# Patient Record
Sex: Male | Born: 1940 | Hispanic: Yes | Marital: Married | Smoking: Current every day smoker
Health system: Southern US, Community
[De-identification: ages and names within clinical notes are randomized; demographics above are authoritative.]

## PROBLEM LIST (undated history)

## (undated) DIAGNOSIS — I499 Cardiac arrhythmia, unspecified: Secondary | ICD-10-CM

## (undated) DIAGNOSIS — C61 Malignant neoplasm of prostate: Secondary | ICD-10-CM

## (undated) HISTORY — PX: INGUINAL HERNIA REPAIR: SUR1180

---

## 2014-02-18 DIAGNOSIS — C61 Malignant neoplasm of prostate: Secondary | ICD-10-CM

## 2014-02-18 HISTORY — PX: PROSTATE BIOPSY: SHX241

## 2014-02-18 HISTORY — DX: Malignant neoplasm of prostate: C61

## 2014-02-26 ENCOUNTER — Other Ambulatory Visit (HOSPITAL_COMMUNITY): Payer: Self-pay | Admitting: Urology

## 2014-02-26 DIAGNOSIS — C61 Malignant neoplasm of prostate: Secondary | ICD-10-CM

## 2014-03-07 ENCOUNTER — Encounter: Payer: Self-pay | Admitting: Radiation Oncology

## 2014-03-07 ENCOUNTER — Telehealth: Payer: Self-pay | Admitting: *Deleted

## 2014-03-07 NOTE — Telephone Encounter (Signed)
Called patient to introduce myself as Prostate Oncology Navigator and coordinator of the Prostate [place], spoke with patient's grandson who provided interpretation: 1)  confirmed referral for the clinic on 03/11/14 with an arrival time of 12:15,  2)  explained format of clinic,  3)  confirmed his understanding of [place] location, 4)  explained arrival and registration procedure, and   5)  indicated I am mailing an [place] to him that contains medical information forms which he should complete and bring with him on the day of clinic.  I provided my phone number and encouraged him to call me if he has any questions after receiving the Information Packet or prior to my call the day before clinic.  He verbalized understanding of information provided. Arrangements being made for interpreter to be present at [facility].  Gayleen Orem, RN, BSN, [place] at [place] [phone]

## 2014-03-07 NOTE — Progress Notes (Signed)
[facility] Location of Tumor / Histology: prostate adenocarcinoma  If Prostate Cancer, Gleason Score is ( 4+ 4) and PSA is (62.82 on 02/06/14)  Joshua Pittman presented 1 month ago with signs/symptoms of: elevated PSA found during screening  Biopsies of  prostate revealed:  02/18/14 Volume 50 cc, 13/14 cores positive  Past/Anticipated interventions by urology, if any: biopsy  Past/Anticipated interventions by medical oncology, if any: to be seen in [facility] on 03/11/14  Weight changes, if any:   Bowel/Bladder complaints, if any:    Nausea/Vomiting, if any:   Pain issues, if any:    SAFETY ISSUES:  Prior radiation?   Pacemaker/ICD? no  Possible current pregnancy? na  Is the patient on methotrexate? no  Current Complaints / other details:  Married, 2 sons, retired, from [place]

## 2014-03-10 ENCOUNTER — Encounter: Payer: Self-pay | Admitting: *Deleted

## 2014-03-10 NOTE — Progress Notes (Signed)
Interpreter Lavon Paganini called patient to inform him that his appt for Prostate [facility] has been cancelled for tomorrow and rescheduled for 10/9.  She spoke with the patient's wife.  Gayleen Orem, RN, BSN, [place] at [place] [phone]

## 2014-03-11 ENCOUNTER — Ambulatory Visit
Admission: RE | Admit: 2014-03-11 | Discharge: 2014-03-11 | Disposition: A | Discharge status: Home / Self Care | Payer: Self-pay | Source: Ambulatory Visit | Attending: Radiation Oncology | Admitting: Radiation Oncology

## 2014-03-11 HISTORY — DX: Cardiac arrhythmia, unspecified: I49.9

## 2014-03-11 HISTORY — DX: Malignant neoplasm of prostate: C61

## 2014-03-14 ENCOUNTER — Encounter (HOSPITAL_COMMUNITY)
Admission: RE | Admit: 2014-03-14 | Discharge: 2014-03-14 | Disposition: A | Discharge status: Home / Self Care | Payer: No Typology Code available for payment source | Source: Ambulatory Visit | Attending: Urology | Admitting: Urology

## 2014-03-14 DIAGNOSIS — C61 Malignant neoplasm of prostate: Secondary | ICD-10-CM | POA: Diagnosis not present

## 2014-03-14 MED ORDER — TECHNETIUM TC 99M MEDRONATE IV KIT
24.3000 | PACK | Freq: Once | INTRAVENOUS | Status: AC | PRN
  Administered 2014-03-14: 24.3 via INTRAVENOUS

## 2014-03-24 ENCOUNTER — Telehealth: Payer: Self-pay | Admitting: Oncology

## 2014-03-24 NOTE — Telephone Encounter (Signed)
Chart is ready for Prostate clinic on Friday.

## 2014-03-26 ENCOUNTER — Telehealth: Payer: Self-pay | Admitting: *Deleted

## 2014-03-26 NOTE — Telephone Encounter (Signed)
With assistance from interpreter Lavon Paganini, called patient to see if he had any questions prior to his attendance at tomorrow's Prostate [place].  She spoke with his wife.  There were no questions, confirmed he would bring the completed health information forms he received in the mailing I sent, confirmed his understanding of a 7:45 am arrival time, confirmed his understanding of the [facility] location.  Gayleen Orem, RN, BSN, [place] at [place] [phone]

## 2014-03-27 ENCOUNTER — Encounter: Payer: Self-pay | Admitting: Radiation Oncology

## 2014-03-27 DIAGNOSIS — C61 Malignant neoplasm of prostate: Secondary | ICD-10-CM | POA: Insufficient documentation

## 2014-03-27 NOTE — Progress Notes (Signed)
Radiation Oncology         [phone] ________________________________  Multidisciplinary Prostate Cancer Clinic  Initial Radiation Oncology Consultation  Name: Joshua Pittman MRN: [id number]  Date: 03/28/2014  DOB: January 24, 1941  CC:No primary provider on file.  [name], MD   REFERRING PHYSICIAN: [name], MD  DIAGNOSIS: 73 y.o. 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 gentleman who describes having an elevated PSA when he was living in [place].  He moved to [place] to live with his family.  He was noted to have an elevated PSA of 46.08 by his primary care physician, Dr. York Ram.  Accordingly, he was referred for evaluation in urology by Dr. Louis Meckel on 02/06/14,  digital rectal examination was performed at that time revealing a 1+ gland with a palpable nodule involving the left mid-gland.  Repeat PSA was 62.82.  The patient proceeded to transrectal ultrasound with 12 biopsies of the prostate on 02/18/14.  The prostate volume measured 50 cc.  Out of 14 core biopsies, 13 were positive.  The maximum Gleason score was 4+4, and this was seen in the graphic displayed distribution shown below.  The patient reviewed the biopsy results with his urologist and he has kindly been referred today to the multidisciplinary prostate cancer clinic for presentation of pathology and radiology studies in our conference for discussion of potential radiation treatment options and clinical evaluation.  PREVIOUS RADIATION THERAPY: No  PAST MEDICAL HISTORY:  has a past medical history of Prostate cancer (02/18/14) and Arrhythmia.    PAST SURGICAL HISTORY: Past Surgical History  Procedure Laterality Date  . Inguinal hernia repair    . Prostate biopsy  02/18/14    gleason 8, vol 50 cc    FAMILY HISTORY: family history includes Asthma in his mother. There is no  history of Cancer.  SOCIAL HISTORY:  reports that he has been smoking Cigarettes.  He has a 8.75 pack-year smoking history. He has never used smokeless tobacco. He reports that he drinks alcohol.  ALLERGIES: Review of patient's allergies indicates no known allergies.  MEDICATIONS:  No current outpatient prescriptions on file.   No current facility-administered medications for this encounter.    REVIEW OF SYSTEMS:  A 15 point review of systems is documented in the electronic medical record. This was obtained by the nursing staff. However, I reviewed this with the patient to discuss relevant findings and make appropriate changes.  A comprehensive review of systems was negative..  The patient completed an IPSS and IIEF questionnaire.   PHYSICAL EXAM: This patient is in no acute distress.  He is alert and oriented.   height is 5' 8.9" (1.75 m) and weight is 150 lb (68.04 kg). His blood pressure is 174/74 and his pulse is 62. His respiration is 16.  He exhibits no respiratory distress or labored breathing.  He appears neurologically intact.  His mood is pleasant.  His affect is appropriate.  Please note the digital rectal exam findings described above.  KPS = 100  100 - Normal; no complaints; no evidence of disease. 90   - Able to carry on normal activity; minor signs or symptoms of disease. 80   - Normal activity with effort; some signs or symptoms of disease. 4   - Cares for self; unable to carry on normal activity or to do active work. 60   - Requires  occasional assistance, but is able to care for most of his personal needs. 50   - Requires considerable assistance and frequent medical care. 58   - Disabled; requires special care and assistance. 54   - Severely disabled; hospital admission is indicated although death not imminent. 87   - Very sick; hospital admission necessary; active supportive treatment necessary. 10   - Moribund; fatal processes progressing rapidly. 0     -  Dead  Karnofsky DA, Abelmann WH, Craver LS and Burchenal JH [phone]) The use of the nitrogen mustards in the palliative treatment of carcinoma: with particular reference to bronchogenic carcinoma Cancer 1 [phone]   LABORATORY DATA:  No results found for this basename: WBC,  HGB,  HCT,  MCV,  PLT   No results found for this basename: NA,  K,  CL,  CO2   No results found for this basename: ALT,  AST,  GGT,  ALKPHOS,  BILITOT     RADIOGRAPHY: Nm Bone Scan Whole Body  03/14/2014   CLINICAL DATA:  Prostate carcinoma  EXAM: NUCLEAR MEDICINE WHOLE BODY BONE SCAN  Views:  Whole-body  Radionuclide:  Technetium 89m methylene diphosphonate  Dose:  24.3 mCi  Route of administration: Intravenous  COMPARISON:  None  FINDINGS: There is increased radiotracer uptake in the superior right acetabulum as well as in the right sacroiliac joint and mid sacrum just to the right of midline. There is modest increased uptake in the regions of the pedicles of L2 and L3 on the left. There is increased uptake in the medial left knee. Elsewhere the distribution of uptake is normal. Kidneys are noted in the flank positions bilaterally. Note that there is dextroscoliosis in the lumbar spine.  IMPRESSION: Areas of increased uptake in the right superior acetabulum, mid sacrum just to the right of midline, and right sacroiliac joint. These areas are concerning for bony metastatic disease. Slight increased uptake in the pedicles at L2 and L3 on the left are noted. Note that there is scoliosis which may be causing increased uptake in these areas due to reactive/degenerative type change as opposed to neoplastic etiology. Increased uptake in the medial left knee is most likely of arthropathic etiology.   Electronically Signed   By: Lowella Grip M.D.   On: 03/14/2014 15:07      IMPRESSION: This gentleman is a 73 y.o. 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  His T-Stage,  Gleason's Score, and PSA put him into the high risk group.  Accordingly he is eligible for a variety of potential treatment options including upfront robotic-assisted laparoscopic radical prostatectomy followed by tailored radiotherapy depending on pathology findings versus up front androgen deprivation for 2-3 years with definitive localized radiotherapy  PLAN:Today I reviewed the findings and workup thus far.  We discussed the natural history of prostate cancer.  We reviewed the the implications of T-stage, Gleason's Score, and PSA on decision-making and outcomes related to prostate cancer.  We discussed radiation treatment in the management of prostate cancer with regard to the logistics and delivery of external beam radiation treatment as well as the logistics and delivery of prostate brachytherapy.  We compared and contrasted each of these approaches and also compared these against prostatectomy.    The patient focused most of his questions and interest in robotic-assisted laparoscopic radical prostatectomy.  We discussed some of the potential advantages of surgery including surgical staging, the availability of salvage radiotherapy to  the prostatic fossa, and the confidence associated with immediate biochemical response.  We discussed some of the potential proven indications for postoperative radiotherapy including positive margins, extracapsular extension, and seminal vesicle involvement. We also talked about some of the other potential findings leading to a recommendation for radiotherapy including a non-zero postoperative PSA and positive lymph nodes.   I filled out a patient counseling form for him outlining his disease characteristics with relevant treatment diagrams and a thorough delineation of radiation including the logistics. The counseling form highlighted our discussion on the potential side effects associated with radiation therapy. The patient was given the form and we retained a copy for  our records.   The patient would like to proceed with prostatectomy. I enjoyed meeting with him today, and will look forward to participating in the care of this very nice gentleman.  I will look forward to following his progress   I spent 60 minutes minutes face to face with the patient and more than 50% of that time was spent in counseling and/or coordination of care.      ------------------------------------------------  Sheral Apley. Tammi Klippel, M.D.

## 2014-03-28 ENCOUNTER — Encounter: Payer: Self-pay | Admitting: *Deleted

## 2014-03-28 ENCOUNTER — Ambulatory Visit (HOSPITAL_BASED_OUTPATIENT_CLINIC_OR_DEPARTMENT_OTHER): Payer: No Typology Code available for payment source | Admitting: Oncology

## 2014-03-28 ENCOUNTER — Encounter: Payer: Self-pay | Admitting: Radiation Oncology

## 2014-03-28 ENCOUNTER — Ambulatory Visit
Admission: RE | Admit: 2014-03-28 | Discharge: 2014-03-28 | Disposition: A | Discharge status: Home / Self Care | Payer: No Typology Code available for payment source | Source: Ambulatory Visit | Attending: Urology | Admitting: Urology

## 2014-03-28 ENCOUNTER — Encounter: Payer: Self-pay | Admitting: Oncology

## 2014-03-28 VITALS — BP 174/74 | HR 62 | Resp 16 | Ht 68.9 in | Wt 150.0 lb

## 2014-03-28 DIAGNOSIS — C61 Malignant neoplasm of prostate: Secondary | ICD-10-CM

## 2014-03-28 NOTE — Consult Note (Signed)
Chief Complaint  Prostate Cancer   Reason For Visit  Reason for consult: To discuss treatment options for high risk prostate cancer. Physician requesting consult: Dr. Burman Nieves PCP: Dr. York Ram Location of Consult: [place]   History of Present Illness  Joshua Pittman is a healthy 73 year old gentleman who was noted to have an elevated PSA during a screening in [place].  His PSA was rechecked by Dr. Jimmye Norman in July 2015 and was found to be 46.08 prompting urologic referral.  He was seen by Dr. Burman Nieves in August 2015 who noted nodularity of the left base and mid prostate and a repeat PSA at that time was 62.82.  He underwent a prostate biopsy on 02/18/14 which demonstrated Gleason 8 adenocarcinoma with 13 out of 14 biopsy cores positive for malignancy. He has no family history of prostate cancer. He underwent staging studies including a bone scan on 04-03-14 and CT scan of the pelvis on 03/25/14.  His bone scan did demonstrate some uptake in the right hemipelvis but no other areas of increased uptake that would suggest metastasis.  Review of his CT scan demonstrated a lucency in the right hemipelvis that appears consistent with a benign process such as prior Paget's disease or degenerative change but without obvious sclerotic lesions to suggest metastatic cancer.  TNM stage: cT3a Nx Mx PSA: 62.82 Gleason score: 4+4=8, 3+5=8 Biopsy (02/18/14): 13/14 cores positive    Left: L Lateral aepx (60%, 4+3=7), L apex (60%, 4+3=7), L lateral mid (90%, 4+3=7), L mid (80%, 4+3=7), L lateral base (95% - 4+3=7, 95% - 4+4=8, PNI), L base (50% - 4+3=7, 50% - 4+4=8)    Right: R apex (70%, 3+3=6), R mid (80%, 3+4=7, PNI), R lateral mid (50%, 3+4=7), R base (80%, 3+5=8), R lateral base (80%, 4+3=7) Prostate volume: 50 cc  Nomogram OC disease: 1% EPE: 99% SVI: 99% LNI: 95% PFS (surgery): 4% at 5 years, 2% at 10 years  Urinary function: He has minimal baseline LUTS. IPSS is  1. Erectile function: SHIM score is 6.  This is in part due to the fact that he has been sexually inactive over the past year.  In fact Joshua Pittman informs me today that he has been sexually inactive for many years and that this is a very low priority for him and his wife.   His history was obtained through an interpreter today.   Past Medical History  1. History of No significant past medical history  Surgical History  1. History of Inguinal Hernia Repair   He has undergone 2 prior right inguinal hernia repairs with mesh.   Current Meds  1. Prostate Support TABS;  Therapy: (Recorded:20Aug2015) to Recorded  Allergies  1. No Known Drug Allergies  Family History  1. Family history of Death of family member : Mother, Father  2. Family history of asthma (Z82.5) : Mother  Social History   Alcohol use (F10.99)   Current every day smoker (F72.200)   Married   Retired  Review of Systems AU Complete-Male: Constitutional, skin, eye, otolaryngeal, hematologic/lymphatic, cardiovascular, pulmonary, endocrine, musculoskeletal, gastrointestinal, neurological and psychiatric system(s) were reviewed and pertinent findings if present are noted.    Physical Exam Constitutional: Well nourished and well developed . No acute distress.  ENT:. The ears and nose are normal in appearance.  Neck: The appearance of the neck is normal and no neck mass is present.  Pulmonary: No respiratory distress, normal respiratory rhythm and effort and clear bilateral  breath sounds.  Cardiovascular: Heart rate and rhythm are normal . No peripheral edema.  Abdomen: The abdomen is soft and nontender. No masses are palpated. No CVA tenderness. No hernias are palpable. No hepatosplenomegaly noted.   He has no abdominal scars.  Specifically, I asked him about the high density area noted in his right upper quadrant on CT scan.  He denies a history of prior trauma, gunshot wound, or surgery in this area.  Speaking with  radiology, it was felt that this may even be within the bowel.  Rectal: Rectal exam demonstrates normal sphincter tone, no tenderness and no masses. Prostate size is estimated to be 50 g. He does have extensive involvement of left side of the prostate including the base and mid gland with evidence of extraprostatic extension insistent with clinical stage TIIIa prostate cancer. The prostate is not tender. The left seminal vesicle is nonpalpable. The right seminal vesicle is nonpalpable. The perineum is normal on inspection.  Lymphatics: The femoral and inguinal nodes are not enlarged or tender.  Skin: Normal skin turgor, no visible rash and no visible skin lesions.  Neuro/Psych:. Mood and affect are appropriate.    Results/Data  We have reviewed his medical records including his PSA results, his pathology slides, and his imaging studies in the multidisciplinary conference today.  Findings are as outlined above.     Discussion/Summary   1.  High risk, locally advanced prostate cancer: I had a long discussion with the patient, his wife, and his son today regarding his prostate cancer situation and options for treatment.  I explained to him that he does not have any obvious evidence of measurable metastatic disease although is at very high risk for micrometastatic cancer considering his disease parameters.  Although he understands what the realistic chances for cure are considering the high risk for micrometastatic disease, he is a candidate for therapy of curative intent with no obvious measurable metastases.  I did recommend to him as standard therapy long-term androgen deprivation therapy and external beam radiation treatment.  We discussed that this would be standard of care treatment and would address systemic disease as well as his local disease.  He and his family were also very interested in primary surgical therapy.  I explained to him that this is an option albeit with an increased risk of urinary  incontinence and understanding that he'll most definitely would require adjuvant therapy with either radiation therapy and possibly with systemic androgen deprivation.  We extensively reviewed the pros and cons today the patient understands that there is no evidence of primary surgical therapy would increase his chances for long-term survival or freedom from disease recurrence.   The patient was counseled about the natural history of prostate cancer and the standard treatment options that are available for prostate cancer. It was explained to him how his age and life expectancy, clinical stage, Gleason score, and PSA affect his prognosis, the decision to proceed with additional staging studies, as well as how that information influences recommended treatment strategies. We discussed the roles for active surveillance, radiation therapy, surgical therapy, androgen deprivation, as well as ablative therapy options for the treatment of prostate cancer as appropriate to his individual cancer situation. We discussed the risks and benefits of these options with regard to their impact on cancer control and also in terms of potential adverse events, complications, and impact on quiality of life particularly related to urinary, bowel, and sexual function. The patient was encouraged to ask questions throughout the discussion  today and all questions were answered to his stated satisfaction. In addition, the patient was provided with and/or directed to appropriate resources and literature for further education about prostate cancer and treatment options.   We discussed surgical therapy for prostate cancer including the different available surgical approaches. We discussed, in detail, the risks and expectations of surgery with regard to cancer control, urinary control, and erectile function as well as the expected postoperative recovery process. Additional risks of surgery including but not limited to bleeding, infection,  hernia formation, nerve damage, lymphocele formation, bowel/rectal injury potentially necessitating colostomy, damage to the urinary tract resulting in urine leakage, urethral stricture, and the cardiopulmonary risks such as myocardial infarction, stroke, death, venothromboembolism, etc. were explained. The risk of open surgical conversion for robotic/laparoscopic prostatectomy was also discussed.      Joshua Pittman also met with Dr. Tammi Klippel and Dr. Alen Blew today.  After considering his options, he and his family remains adamant that they would like to proceed with primary surgical therapy and clearly expressed her understanding of the very low risk for cure with surgical therapy alone, the very likely need for adjuvant therapy in the form of radiation treatment or systemic androgen deprivation therapy, and the increased risk for urinary incontinence with this approach.  I will plan to talk with Dr. Louis Meckel and we'll plan to proceed according to Joshua Pittman' wishes.  He will tentatively be scheduled for a non-nerve sparing robotic-assisted laparoscopic radical prostatectomy and extended pelvic lymphadenectomy with plans to proceed with adjuvant therapy is indicated based on his surgical pathology.  Cc: Dr. Zola Button Dr. Tyler Pita Dr. Louis Meckel Dr. York Ram  A total of 75 minutes were spent in the overall care of the patient today with 65 minutes in direct face to face consultation.  All information was communicated through a Spanish interpreter today.    Signatures Electronically signed by : [name], M.D.; Mar 28 2014 11:22AM EST

## 2014-03-28 NOTE — Progress Notes (Signed)
[place] Work     Clinical Social Work met with pt and his wife, niece and nephew at [facility] for assessment of psychosocial needs and review of [place] Support Programs/Team. CSW was assisted by an interpreter as pt's native language is Spanish. Pt reports he moved with his wife from [place] two years ago.  Pt reports to feel very little distress and feels well-informed after meeting with the medical team today. CSW discussed common emotions and concerns experienced by patients and families in their situation. Pt and wife report to not drive and their biggest concern is transportation. They can get their niece and nephew to assist occasionally, but both work. CSW reviewed transportation resources; [facility], the bus and [employer]. Due to language barriers, they report to not to utilize the bus for transportation. Clinical Social Worker discussed resources and members of the Pt and Family Support Team, Prostate Support group and other supports available. CSW gave handouts for all applicable resources and reviewed them.  Pt and family very appreciative of support and agree to reach out to team as needed.   ONCBCN DISTRESS SCREENING 03/28/2014  Screening Type Initial Screening  Mark the number that describes how much distress you have been experiencing in the past week 2  Practical problem type Transportation   Clinical Social Work Interventions: Resource education Solution focused interviewing Emotional support  Loren Racer, [place]  [place] Phone: [phone] Fax: [phone]

## 2014-03-28 NOTE — Progress Notes (Signed)
Met with patient as part of Prostate MDC.  Interpreter present for MD and support staff visits.  Reintroduced my role as coordinator of the clinic  and encouraged him to call me should he proceed with treatments and appointments at [facility].   After MD discussions, patient decided to move forward with prostatectomy.  I explained that he will receive a phone call from his urologist, Dr. Louis Meckel, [facility], to arrange an appt to discuss further.  He verbalized understanding.  He requested that Dr. Louis Meckel provide a signed letter that provides details of the surgery, including date/time, recovery period.  This letter is needed to facilitate his sister's travel from [place] to [place] so that she can be with him.  I indicated I would relay this request to Dr. Louis Meckel.  Gayleen Orem, RN, BSN, [place] at [place] [phone]

## 2014-03-28 NOTE — Consult Note (Signed)
Reason for Referral: Prostate cancer.   HPI: 73 year old [place] gentleman currently of [place] where he lives with his family. He is rather healthy gentleman without any significant comorbid conditions who is referred by his primary care physician to Dr. Louis Meckel for an elevated PSA of 46.08 in July of 2015. Upon his evaluation, he was found to have a palpable nodule in the left aspect of the prostate. Otherwise examination was normal. He had very little voiding symptoms he does report some urinary urinary frequency dysuria and occasional nocturia. He underwent a biopsy on 02/06/2014 which showed prostate cancer high volume disease with Gleason score 4+4 equals 8 in the majority of the cores. Patient referred to the multidisciplinary breast clinic for further discussion.  Clinically, he feels relatively well and reports no major constitutional symptoms. He does not report any fevers or chills or sweats. He reports no chest pain or shortness of breath. He reports no nausea or vomiting or abdominal pain. He does not report any skeletal complaints or arthralgias or myalgias. Continues to be in excellent performance status and the rest of review of systems unremarkable.  Past Medical History  Diagnosis Date  . Prostate cancer 02/18/14    gleason 8  . Arrhythmia   :  Past Surgical History  Procedure Laterality Date  . Inguinal hernia repair    . Prostate biopsy  02/18/14    gleason 8, vol 50 cc  :  No current outpatient prescriptions on file.:  No Known Allergies:  Family History  Problem Relation Age of Onset  . Asthma Mother   . Cancer Neg Hx   :  History   Social History  . Marital Status: Married    Spouse Name: N/A    Number of Children: N/A  . Years of Education: N/A   Occupational History  . Not on file.   Social History Main Topics  . Smoking status: Current Every Day Smoker -- 0.25 packs/day for 35 years    Types: Cigarettes  . Smokeless tobacco: Never Used      Comment: 3-4 cigs daily  . Alcohol Use: Yes     Comment: beer socially on the weekends  . Drug Use: Not on file  . Sexual Activity: Yes   Other Topics Concern  . Not on file   Social History Narrative  . No narrative on file  :  Pertinent items are noted in HPI.  Exam: There were no vitals taken for this visit. General appearance: alert and cooperative Nose: Nares normal. Septum midline. Mucosa normal. No drainage or sinus tenderness. Throat: lips, mucosa, and tongue normal; teeth and gums normal Neck: no adenopathy Back: negative Resp: clear to auscultation bilaterally Cardio: regular rate and rhythm, S1, S2 normal, no murmur, click, rub or gallop GI: soft, non-tender; bowel sounds normal; no masses,  no organomegaly Extremities: extremities normal, atraumatic, no cyanosis or edema    03/14/2014   CLINICAL DATA:  Prostate carcinoma  EXAM: NUCLEAR MEDICINE WHOLE BODY BONE SCAN  Views:  Whole-body  Radionuclide:  Technetium 12m methylene diphosphonate  Dose:  24.3 mCi  Route of administration: Intravenous  COMPARISON:  None  FINDINGS: There is increased radiotracer uptake in the superior right acetabulum as well as in the right sacroiliac joint and mid sacrum just to the right of midline. There is modest increased uptake in the regions of the pedicles of L2 and L3 on the left. There is increased uptake in the medial left knee. Elsewhere the distribution of uptake is  normal. Kidneys are noted in the flank positions bilaterally. Note that there is dextroscoliosis in the lumbar spine.  IMPRESSION: Areas of increased uptake in the right superior acetabulum, mid sacrum just to the right of midline, and right sacroiliac joint. These areas are concerning for bony metastatic disease. Slight increased uptake in the pedicles at L2 and L3 on the left are noted. Note that there is scoliosis which may be causing increased uptake in these areas due to reactive/degenerative type change as opposed to  neoplastic etiology. Increased uptake in the medial left knee is most likely of arthropathic etiology.   Electronically Signed   By: Lowella Grip M.D.   On: 03/14/2014 15:07    Assessment and Plan:    73 year old gentleman with a diagnosis of being confined high-risk prostate cancer. His Gleason score is 4+4 equals 8 with a PSA of 62.82. His case was discussed and the prostate cancer multidisciplinary clinic. This pathology was reviewed with the reviewing pathologists. Imaging studies including a bone scan and CT scan were reviewed and did not show any evidence of metastasis. Options of treatment were discussed with the patient I would include radiation therapy with long-term androgen deprivation versus surgical resection. The role of systemic chemotherapy was discussed with the patient today briefly. There is very limited data at this point to support the use of systemic chemotherapy in this particular setting. I feel chemotherapy would better suited at this time to be used at a different time if he develops advanced disease. He will contemplate the options of surgery versus radiation therapy with androgen deprivation and will make his decision in the future.

## 2014-03-28 NOTE — Progress Notes (Signed)
73 year old male. Married with two sons. One of 23 children. Married to [employer]. Son, Cathlean Cower is a Administrator. Son, Gerre Scull works with asbestos. Reports he had a colonoscopy done 02/18/2014. Reports that he does perform regular testicular exams. Reports pain associated with urination. Patient did not complete IPSS form.

## 2014-03-28 NOTE — Progress Notes (Signed)
Please see consult note.  

## 2014-04-04 ENCOUNTER — Other Ambulatory Visit: Payer: Self-pay | Admitting: Urology

## 2014-04-07 ENCOUNTER — Encounter (HOSPITAL_COMMUNITY): Payer: Self-pay | Admitting: *Deleted

## 2014-04-08 ENCOUNTER — Encounter (HOSPITAL_COMMUNITY): Payer: Self-pay | Admitting: Pharmacy Technician

## 2014-04-08 NOTE — Progress Notes (Signed)
Spoke with [employer], interpreter for pre op 04-09-14 will be angel arriving at 130 pm admitting 04-09-14, day of surgery interpreter spanish is agnel also, arriving 900 am [facility] short stay 04-14-14

## 2014-04-09 ENCOUNTER — Encounter (HOSPITAL_COMMUNITY)
Admission: RE | Admit: 2014-04-09 | Discharge: 2014-04-09 | Disposition: A | Discharge status: Home / Self Care | Payer: No Typology Code available for payment source | Source: Ambulatory Visit | Attending: Urology | Admitting: Urology

## 2014-04-09 ENCOUNTER — Encounter (HOSPITAL_COMMUNITY): Payer: Self-pay

## 2014-04-09 ENCOUNTER — Ambulatory Visit (HOSPITAL_COMMUNITY)
Admission: RE | Admit: 2014-04-09 | Discharge: 2014-04-09 | Disposition: A | Discharge status: Home / Self Care | Payer: No Typology Code available for payment source | Source: Ambulatory Visit | Attending: Urology | Admitting: Urology

## 2014-04-09 DIAGNOSIS — Z01818 Encounter for other preprocedural examination: Secondary | ICD-10-CM

## 2014-04-09 DIAGNOSIS — C61 Malignant neoplasm of prostate: Secondary | ICD-10-CM | POA: Insufficient documentation

## 2014-04-09 LAB — CBC
HCT: 40.3 % (ref 39.0–52.0)
Hemoglobin: 13.8 g/dL (ref 13.0–17.0)
MCH: 31.7 pg (ref 26.0–34.0)
MCHC: 34.2 g/dL (ref 30.0–36.0)
MCV: 92.6 fL (ref 78.0–100.0)
PLATELETS: 300 10*3/uL (ref 150–400)
RBC: 4.35 MIL/uL (ref 4.22–5.81)
RDW: 13.2 % (ref 11.5–15.5)
WBC: 6.4 10*3/uL (ref 4.0–10.5)

## 2014-04-09 LAB — BASIC METABOLIC PANEL
Anion gap: 12 (ref 5–15)
BUN: 15 mg/dL (ref 6–23)
CALCIUM: 9.5 mg/dL (ref 8.4–10.5)
CO2: 26 mEq/L (ref 19–32)
Chloride: 101 mEq/L (ref 96–112)
Creatinine, Ser: 0.88 mg/dL (ref 0.50–1.35)
GFR calc non Af Amer: 84 mL/min — ABNORMAL LOW (ref >90)
Glucose, Bld: 90 mg/dL (ref 70–99)
Potassium: 4.6 mEq/L (ref 3.7–5.3)
SODIUM: 139 meq/L (ref 137–147)

## 2014-04-09 NOTE — Patient Instructions (Addendum)
Tanay Rojas-Piloto  04/09/2014   Your procedure is scheduled on: 04/14/2014     Come thru the [facility] doors   Follow the Signs to [facility] at 0900       am  Call this number if you have problems the morning of surgery: [phone]   Remember:   Do not eat food or drink liquids after midnight.   Take these medicines the morning of surgery with A SIP OF WATER: none    Do not wear jewelry.   Men may shave face and neck.  Do not bring valuables to the hospital.  Contacts, dentures or bridgework may not be worn into surgery.  Leave suitcase in the car. After surgery it may be brought to your room.  For patients admitted to the hospital, checkout time is 11:00 AM the day of  discharge.    Please read over the following fact sheets that you were given:  WHAT IS A BLOOD TRANSFUSION? Blood Transfusion Information  A transfusion is the replacement of blood or some of its parts. Blood is made up of multiple cells which provide different functions.  Red blood cells carry oxygen and are used for blood loss replacement.  White blood cells fight against infection.  Platelets control bleeding.  Plasma helps clot blood.  Other blood products are available for specialized needs, such as hemophilia or other clotting disorders. BEFORE THE TRANSFUSION  Who gives blood for transfusions?   Healthy volunteers who are fully evaluated to make sure their blood is safe. This is blood bank blood. Transfusion therapy is the safest it has ever been in the practice of medicine. Before blood is taken from a donor, a complete history is taken to make sure that person has no history of diseases nor engages in risky social behavior (examples are intravenous drug use or sexual activity with multiple partners). The donor's travel history is screened to minimize risk of transmitting infections, such as malaria. The donated blood is tested for signs of infectious diseases, such as HIV and hepatitis. The  blood is then tested to be sure it is compatible with you in order to minimize the chance of a transfusion reaction. If you or a relative donates blood, this is often done in anticipation of surgery and is not appropriate for emergency situations. It takes many days to process the donated blood. RISKS AND COMPLICATIONS Although transfusion therapy is very safe and saves many lives, the main dangers of transfusion include:   Getting an infectious disease.  Developing a transfusion reaction. This is an allergic reaction to something in the blood you were given. Every precaution is taken to prevent this. The decision to have a blood transfusion has been considered carefully by your caregiver before blood is given. Blood is not given unless the benefits outweigh the risks. AFTER THE TRANSFUSION  Right after receiving a blood transfusion, you will usually feel much better and more energetic. This is especially true if your red blood cells have gotten low (anemic). The transfusion raises the level of the red blood cells which carry oxygen, and this usually causes an energy increase.  The nurse administering the transfusion will monitor you carefully for complications. HOME CARE INSTRUCTIONS  No special instructions are needed after a transfusion. You may find your energy is better. Speak with your caregiver about any limitations on activity for underlying diseases you may have. SEEK MEDICAL CARE IF:   Your condition is not improving after your transfusion.  You develop  redness or irritation at the intravenous (IV) site. SEEK IMMEDIATE MEDICAL CARE IF:  Any of the following symptoms occur over the next 12 hours:  Shaking chills.  You have a temperature by mouth above 102 F (38.9 C), not controlled by medicine.  Chest, back, or muscle pain.  People around you feel you are not acting correctly or are confused.  Shortness of breath or difficulty breathing.  Dizziness and fainting.  You get  a rash or develop hives.  You have a decrease in urine output.  Your urine turns a dark color or changes to pink, red, or brown. Any of the following symptoms occur over the next 10 days:  You have a temperature by mouth above 102 F (38.9 C), not controlled by medicine.  Shortness of breath.  Weakness after normal activity.  The white part of the eye turns yellow (jaundice).  You have a decrease in the amount of urine or are urinating less often.  Your urine turns a dark color or changes to pink, red, or brown. Document Released: 06/03/2000 Document Revised: 08/29/2011 Document Reviewed: 01/21/2008 ExitCare Patient Information 2014 [place].  _______________________________________________________________________[facility] - Preparing for Surgery Before surgery, you can play an important role.  Because skin is not sterile, your skin needs to be as free of germs as possible.  You can reduce the number of germs on your skin by washing with CHG (chlorahexidine gluconate) soap before surgery.  CHG is an antiseptic cleaner which kills germs and bonds with the skin to continue killing germs even after washing. Please DO NOT use if you have an allergy to CHG or antibacterial soaps.  If your skin becomes reddened/irritated stop using the CHG and inform your nurse when you arrive at Short Stay. Do not shave (including legs and underarms) for at least 48 hours prior to the first CHG shower.  You may shave your face/neck. Please follow these instructions carefully:  1.  Shower with CHG Soap the night before surgery and the  morning of Surgery.  2.  If you choose to wash your hair, wash your hair first as usual with your  normal  shampoo.  3.  After you shampoo, rinse your hair and body thoroughly to remove the  shampoo.                           4.  Use CHG as you would any other liquid soap.  You can apply chg directly  to the skin and wash                       Gently with a scrungie or  clean washcloth.  5.  Apply the CHG Soap to your body ONLY FROM THE NECK DOWN.   Do not use on face/ open                           Wound or open sores. Avoid contact with eyes, ears mouth and genitals (private parts).                       Wash face,  Genitals (private parts) with your normal soap.             6.  Wash thoroughly, paying special attention to the area where your surgery  will be performed.  7.  Thoroughly rinse your body with warm water  from the neck down.  8.  DO NOT shower/wash with your normal soap after using and rinsing off  the CHG Soap.                9.  Pat yourself dry with a clean towel.            10.  Wear clean pajamas.            11.  Place clean sheets on your bed the night of your first shower and do not  sleep with pets. Day of Surgery : Do not apply any lotions/deodorants the morning of surgery.  Please wear clean clothes to the hospital/surgery center.  FAILURE TO FOLLOW THESE INSTRUCTIONS MAY RESULT IN THE CANCELLATION OF YOUR SURGERY PATIENT SIGNATURE_________________________________  NURSE SIGNATURE__________________________________  ________________________________________________________________________   Adam Phenix  An incentive spirometer is a tool that can help keep your lungs clear and active. This tool measures how well you are filling your lungs with each breath. Taking long deep breaths may help reverse or decrease the chance of developing breathing (pulmonary) problems (especially infection) following:  A long period of time when you are unable to move or be active. BEFORE THE PROCEDURE   If the spirometer includes an indicator to show your best effort, your nurse or respiratory therapist will set it to a desired goal.  If possible, sit up straight or lean slightly forward. Try not to slouch.  Hold the incentive spirometer in an upright position. INSTRUCTIONS FOR USE  1. Sit on the edge of your bed if possible, or sit up as  far as you can in bed or on a chair. 2. Hold the incentive spirometer in an upright position. 3. Breathe out normally. 4. Place the mouthpiece in your mouth and seal your lips tightly around it. 5. Breathe in slowly and as deeply as possible, raising the piston or the ball toward the top of the column. 6. Hold your breath for 3-5 seconds or for as long as possible. Allow the piston or ball to fall to the bottom of the column. 7. Remove the mouthpiece from your mouth and breathe out normally. 8. Rest for a few seconds and repeat Steps 1 through 7 at least 10 times every 1-2 hours when you are awake. Take your time and take a few normal breaths between deep breaths. 9. The spirometer may include an indicator to show your best effort. Use the indicator as a goal to work toward during each repetition. 10. After each set of 10 deep breaths, practice coughing to be sure your lungs are clear. If you have an incision (the cut made at the time of surgery), support your incision when coughing by placing a pillow or rolled up towels firmly against it. Once you are able to get out of bed, walk around indoors and cough well. You may stop using the incentive spirometer when instructed by your caregiver.  RISKS AND COMPLICATIONS  Take your time so you do not get dizzy or light-headed.  If you are in pain, you may need to take or ask for pain medication before doing incentive spirometry. It is harder to take a deep breath if you are having pain. AFTER USE  Rest and breathe slowly and easily.  It can be helpful to keep track of a log of your progress. Your caregiver can provide you with a simple table to help with this. If you are using the spirometer at home, follow these instructions: [facility]  CARE IF:   You are having difficultly using the spirometer.  You have trouble using the spirometer as often as instructed.  Your pain medication is not giving enough relief while using the spirometer.  You  develop fever of 100.5 F (38.1 C) or higher. SEEK IMMEDIATE MEDICAL CARE IF:   You cough up bloody sputum that had not been present before.  You develop fever of 102 F (38.9 C) or greater.  You develop worsening pain at or near the incision site. MAKE SURE YOU:   Understand these instructions.  Will watch your condition.  Will get help right away if you are not doing well or get worse. Document Released: 10/17/2006 Document Revised: 08/29/2011 Document Reviewed: 12/18/2006 ExitCare Patient Information 2014 [place].   ________________________________________________________________________ , coughing and deep breathing exercises, leg exercises

## 2014-04-10 LAB — ABO/RH: ABO/RH(D): O POS

## 2014-04-10 NOTE — Progress Notes (Signed)
EKG and CXR done 04/09/14 in [facility].

## 2014-04-10 NOTE — Progress Notes (Signed)
Language Resources present at time of preop appointment on 04/09/14 for Interpreters.

## 2014-04-10 NOTE — Anesthesia Preprocedure Evaluation (Addendum)
Anesthesia Evaluation  Patient identified by MRN, date of birth, ID band Patient awake  General Assessment Comment:Discussed with interpreter present  Reviewed: Allergy & Precautions, H&P , NPO status , Patient's Chart, lab work & pertinent test results  History of Anesthesia Complications Negative for: history of anesthetic complications  Airway Mallampati: II  TM Distance: >3 FB Neck ROM: Full    Dental no notable dental hx. (+) Missing, Dental Advisory Given   Pulmonary Current Smoker,  breath sounds clear to auscultation  Pulmonary exam normal       Cardiovascular Exercise Tolerance: Good + dysrhythmias Rhythm:Regular Rate:Normal  Hx of daily strenuous activity without any problems   Neuro/Psych negative neurological ROS  negative psych ROS   GI/Hepatic negative GI ROS, Neg liver ROS,   Endo/Other  negative endocrine ROS  Renal/GU negative Renal ROS  negative genitourinary   Musculoskeletal negative musculoskeletal ROS (+)   Abdominal   Peds negative pediatric ROS (+)  Hematology negative hematology ROS (+)   Anesthesia Other Findings Hx of prostate cancer  Reproductive/Obstetrics negative OB ROS                            Anesthesia Physical Anesthesia Plan  ASA: II  Anesthesia Plan: General   Post-op Pain Management:    Induction: Intravenous  Airway Management Planned: Oral ETT  Additional Equipment:   Intra-op Plan:   Post-operative Plan: Extubation in OR  Informed Consent: I have reviewed the patients History and Physical, chart, labs and discussed the procedure including the risks, benefits and alternatives for the proposed anesthesia with the patient or authorized representative who has indicated his/her understanding and acceptance.   Dental advisory given  Plan Discussed with: CRNA  Anesthesia Plan Comments:         Anesthesia Quick Evaluation

## 2014-04-11 NOTE — H&P (Signed)
Chief Complaint  Prostate Cancer   Reason For Visit  Reason for consult: To discuss treatment options for high risk prostate cancer. Physician requesting consult: Dr. Cordella Register PCP: Dr. Lerry Liner Location of Consult: [facility]   History of Present Illness  [name] is a healthy 73 year old gentleman who was noted to have an elevated PSA during a screening in [place].  His PSA was rechecked by Dr. Mayford Knife in July 2015 and was found to be 46.08 prompting urologic referral.  He was seen by Dr. Cordella Register in August 2015 who noted nodularity of the left base and mid prostate and a repeat PSA at that time was 62.82.  He underwent a prostate biopsy on 02/18/14 which demonstrated Gleason 8 adenocarcinoma with 13 out of 14 biopsy cores positive for malignancy. He has no family history of prostate cancer. He underwent staging studies including a bone scan on 04/12/2014 and CT scan of the pelvis on 03/25/14.  His bone scan did demonstrate some uptake in the right hemipelvis but no other areas of increased uptake that would suggest metastasis.  Review of his CT scan demonstrated a lucency in the right hemipelvis that appears consistent with a benign process such as prior Paget's disease or degenerative change but without obvious sclerotic lesions to suggest metastatic cancer.  TNM stage: cT3a Nx Mx PSA: 62.82 Gleason score: 4+4=8, 3+5=8 Biopsy (02/18/14): 13/14 cores positive    Left: L Lateral aepx (60%, 4+3=7), L apex (60%, 4+3=7), L lateral mid (90%, 4+3=7), L mid (80%, 4+3=7), L lateral base (95% - 4+3=7, 95% - 4+4=8, PNI), L base (50% - 4+3=7, 50% - 4+4=8)    Right: R apex (70%, 3+3=6), R mid (80%, 3+4=7, PNI), R lateral mid (50%, 3+4=7), R base (80%, 3+5=8), R lateral base (80%, 4+3=7) Prostate volume: 50 cc  Nomogram OC disease: 1% EPE: 99% SVI: 99% LNI: 95% PFS (surgery): 4% at 5 years, 2% at 10 years  Urinary function: He has minimal baseline LUTS. IPSS is  1. Erectile function: SHIM score is 6.  This is in part due to the fact that he has been sexually inactive over the past year.  In fact Mr. Synetta Fail informs me today that he has been sexually inactive for many years and that this is a very low priority for him and his wife.   His history was obtained through an interpreter today.   Past Medical History  1. History of No significant past medical history  Surgical History  1. History of Inguinal Hernia Repair   He has undergone 2 prior right inguinal hernia repairs with mesh.   Current Meds  1. Prostate Support TABS;  Therapy: (Recorded:20Aug2015) to Recorded  Allergies  1. No Known Drug Allergies  Family History  1. Family history of Death of family member : Mother, Father  2. Family history of asthma (Z82.5) : Mother  Social History   Alcohol use (F10.99)   Current every day smoker (F68.200)   Married   Retired  Review of Systems AU Complete-Male: Constitutional, skin, eye, otolaryngeal, hematologic/lymphatic, cardiovascular, pulmonary, endocrine, musculoskeletal, gastrointestinal, neurological and psychiatric system(s) were reviewed and pertinent findings if present are noted.    Physical Exam Constitutional: Well nourished and well developed . No acute distress.  ENT:. The ears and nose are normal in appearance.  Neck: The appearance of the neck is normal and no neck mass is present.  Pulmonary: No respiratory distress, normal respiratory rhythm and effort and clear bilateral  breath sounds.  Cardiovascular: Heart rate and rhythm are normal . No peripheral edema.  Abdomen: The abdomen is soft and nontender. No masses are palpated. No CVA tenderness. No hernias are palpable. No hepatosplenomegaly noted.   He has no abdominal scars.  Specifically, I asked him about the high density area noted in his right upper quadrant on CT scan.  He denies a history of prior trauma, gunshot wound, or surgery in this area.  Speaking with  radiology, it was felt that this may even be within the bowel.  Rectal: Rectal exam demonstrates normal sphincter tone, no tenderness and no masses. Prostate size is estimated to be 50 g. He does have extensive involvement of left side of the prostate including the base and mid gland with evidence of extraprostatic extension insistent with clinical stage TIIIa prostate cancer. The prostate is not tender. The left seminal vesicle is nonpalpable. The right seminal vesicle is nonpalpable. The perineum is normal on inspection.  Lymphatics: The femoral and inguinal nodes are not enlarged or tender.  Skin: Normal skin turgor, no visible rash and no visible skin lesions.  Neuro/Psych:. Mood and affect are appropriate.    Results/Data  We have reviewed his medical records including his PSA results, his pathology slides, and his imaging studies in the multidisciplinary conference today.  Findings are as outlined above.     Discussion/Summary   1.  High risk, locally advanced prostate cancer: I had a long discussion with the patient, his wife, and his son today regarding his prostate cancer situation and options for treatment.  I explained to him that he does not have any obvious evidence of measurable metastatic disease although is at very high risk for micrometastatic cancer considering his disease parameters.  Although he understands what the realistic chances for cure are considering the high risk for micrometastatic disease, he is a candidate for therapy of curative intent with no obvious measurable metastases.  I did recommend to him as standard therapy long-term androgen deprivation therapy and external beam radiation treatment.  We discussed that this would be standard of care treatment and would address systemic disease as well as his local disease.  He and his family were also very interested in primary surgical therapy.  I explained to him that this is an option albeit with an increased risk of urinary  incontinence and understanding that he'll most definitely would require adjuvant therapy with either radiation therapy and possibly with systemic androgen deprivation.  We extensively reviewed the pros and cons today the patient understands that there is no evidence of primary surgical therapy would increase his chances for long-term survival or freedom from disease recurrence.   The patient was counseled about the natural history of prostate cancer and the standard treatment options that are available for prostate cancer. It was explained to him how his age and life expectancy, clinical stage, Gleason score, and PSA affect his prognosis, the decision to proceed with additional staging studies, as well as how that information influences recommended treatment strategies. We discussed the roles for active surveillance, radiation therapy, surgical therapy, androgen deprivation, as well as ablative therapy options for the treatment of prostate cancer as appropriate to his individual cancer situation. We discussed the risks and benefits of these options with regard to their impact on cancer control and also in terms of potential adverse events, complications, and impact on quiality of life particularly related to urinary, bowel, and sexual function. The patient was encouraged to ask questions throughout the discussion  today and all questions were answered to his stated satisfaction. In addition, the patient was provided with and/or directed to appropriate resources and literature for further education about prostate cancer and treatment options.   We discussed surgical therapy for prostate cancer including the different available surgical approaches. We discussed, in detail, the risks and expectations of surgery with regard to cancer control, urinary control, and erectile function as well as the expected postoperative recovery process. Additional risks of surgery including but not limited to bleeding, infection,  hernia formation, nerve damage, lymphocele formation, bowel/rectal injury potentially necessitating colostomy, damage to the urinary tract resulting in urine leakage, urethral stricture, and the cardiopulmonary risks such as myocardial infarction, stroke, death, venothromboembolism, etc. were explained. The risk of open surgical conversion for robotic/laparoscopic prostatectomy was also discussed.      Mr. Terisa Starr also met with Dr. Tammi Klippel and Dr. Alen Blew today.  After considering his options, he and his family remains adamant that they would like to proceed with primary surgical therapy and clearly expressed her understanding of the very low risk for cure with surgical therapy alone, the very likely need for adjuvant therapy in the form of radiation treatment or systemic androgen deprivation therapy, and the increased risk for urinary incontinence with this approach.  I will plan to talk with Dr. Louis Meckel and we'll plan to proceed according to Mr. Terisa Starr' wishes.  He will tentatively be scheduled for a non-nerve sparing robotic-assisted laparoscopic radical prostatectomy and extended pelvic lymphadenectomy with plans to proceed with adjuvant therapy is indicated based on his surgical pathology.  Cc: Dr. Zola Button Dr. Tyler Pita Dr. Louis Meckel Dr. York Ram  A total of 75 minutes were spent in the overall care of the patient today with 65 minutes in direct face to face consultation.  All information was communicated through a Spanish interpreter today.    Signatures Electronically signed by : [name], M.D.; Mar 28 2014 11:22AM EST

## 2014-04-14 ENCOUNTER — Encounter (HOSPITAL_COMMUNITY): Payer: Self-pay | Admitting: *Deleted

## 2014-04-14 ENCOUNTER — Encounter (HOSPITAL_COMMUNITY): Payer: No Typology Code available for payment source | Admitting: Anesthesiology

## 2014-04-14 ENCOUNTER — Encounter (HOSPITAL_COMMUNITY)
Admission: RE | Disposition: A | Discharge status: Home / Self Care | Payer: Self-pay | Source: Ambulatory Visit | Attending: Urology

## 2014-04-14 ENCOUNTER — Inpatient Hospital Stay (HOSPITAL_COMMUNITY): Payer: No Typology Code available for payment source | Admitting: Anesthesiology

## 2014-04-14 ENCOUNTER — Inpatient Hospital Stay (HOSPITAL_COMMUNITY)
Admission: RE | Admit: 2014-04-14 | Discharge: 2014-04-15 | DRG: 708 | Disposition: A | Discharge status: Home / Self Care | Payer: No Typology Code available for payment source | Source: Ambulatory Visit | Attending: Urology | Admitting: Urology

## 2014-04-14 DIAGNOSIS — F17210 Nicotine dependence, cigarettes, uncomplicated: Secondary | ICD-10-CM | POA: Diagnosis present

## 2014-04-14 DIAGNOSIS — C61 Malignant neoplasm of prostate: Secondary | ICD-10-CM | POA: Diagnosis present

## 2014-04-14 HISTORY — PX: ROBOT ASSISTED LAPAROSCOPIC RADICAL PROSTATECTOMY: SHX5141

## 2014-04-14 HISTORY — PX: LYMPHADENECTOMY: SHX5960

## 2014-04-14 LAB — TYPE AND SCREEN
ABO/RH(D): O POS
ANTIBODY SCREEN: NEGATIVE

## 2014-04-14 LAB — HEMOGLOBIN AND HEMATOCRIT, BLOOD
HCT: 40.8 % (ref 39.0–52.0)
HEMOGLOBIN: 13.7 g/dL (ref 13.0–17.0)

## 2014-04-14 SURGERY — ROBOTIC ASSISTED LAPAROSCOPIC RADICAL PROSTATECTOMY LEVEL 3
Anesthesia: General

## 2014-04-14 MED ORDER — EPHEDRINE SULFATE 50 MG/ML IJ SOLN
INTRAMUSCULAR | Status: AC
  Filled 2014-04-14: qty 1

## 2014-04-14 MED ORDER — SODIUM CHLORIDE 0.9 % IJ SOLN
INTRAMUSCULAR | Status: AC
  Filled 2014-04-14: qty 10

## 2014-04-14 MED ORDER — ONDANSETRON HCL 4 MG/2ML IJ SOLN
INTRAMUSCULAR | Status: DC | PRN
Start: 2014-04-14 — End: 2014-04-14
  Administered 2014-04-14: 4 mg via INTRAVENOUS

## 2014-04-14 MED ORDER — LACTATED RINGERS IV SOLN
INTRAVENOUS | Status: DC | PRN
  Administered 2014-04-14: 12:00:00

## 2014-04-14 MED ORDER — SODIUM CHLORIDE 0.9 % IV BOLUS (SEPSIS)
1000.0000 mL | Freq: Once | INTRAVENOUS | Status: AC
  Administered 2014-04-14: 1000 mL via INTRAVENOUS

## 2014-04-14 MED ORDER — PHENYLEPHRINE HCL 10 MG/ML IJ SOLN
INTRAMUSCULAR | Status: DC | PRN
  Administered 2014-04-14 (×3): 40 ug via INTRAVENOUS
  Administered 2014-04-14: 80 ug via INTRAVENOUS
  Administered 2014-04-14 (×4): 40 ug via INTRAVENOUS
  Administered 2014-04-14: 80 ug via INTRAVENOUS

## 2014-04-14 MED ORDER — CEFAZOLIN SODIUM-DEXTROSE 2-3 GM-% IV SOLR
INTRAVENOUS | Status: AC
  Filled 2014-04-14: qty 50

## 2014-04-14 MED ORDER — FENTANYL CITRATE 0.05 MG/ML IJ SOLN
INTRAMUSCULAR | Status: AC
  Filled 2014-04-14: qty 5

## 2014-04-14 MED ORDER — ROCURONIUM BROMIDE 100 MG/10ML IV SOLN
INTRAVENOUS | Status: DC | PRN
  Administered 2014-04-14: 10 mg via INTRAVENOUS
  Administered 2014-04-14: 30 mg via INTRAVENOUS
  Administered 2014-04-14 (×7): 10 mg via INTRAVENOUS

## 2014-04-14 MED ORDER — SODIUM CHLORIDE 0.9 % IR SOLN
Status: DC | PRN
  Administered 2014-04-14: 300 mL via INTRAVESICAL

## 2014-04-14 MED ORDER — PHENYLEPHRINE 40 MCG/ML (10ML) SYRINGE FOR IV PUSH (FOR BLOOD PRESSURE SUPPORT)
PREFILLED_SYRINGE | INTRAVENOUS | Status: AC
  Filled 2014-04-14: qty 10

## 2014-04-14 MED ORDER — KETOROLAC TROMETHAMINE 15 MG/ML IJ SOLN
15.0000 mg | Freq: Four times a day (QID) | INTRAMUSCULAR | Status: DC
  Administered 2014-04-14 – 2014-04-15 (×3): 15 mg via INTRAVENOUS
  Filled 2014-04-14 (×6): qty 1

## 2014-04-14 MED ORDER — PROPOFOL 10 MG/ML IV BOLUS
INTRAVENOUS | Status: DC | PRN
  Administered 2014-04-14: 160 mg via INTRAVENOUS

## 2014-04-14 MED ORDER — DIPHENHYDRAMINE HCL 12.5 MG/5ML PO ELIX: 12.5000 mg | ORAL_SOLUTION | Freq: Four times a day (QID) | ORAL | Status: DC | PRN

## 2014-04-14 MED ORDER — FENTANYL CITRATE 0.05 MG/ML IJ SOLN
INTRAMUSCULAR | Status: DC | PRN
  Administered 2014-04-14 (×9): 50 ug via INTRAVENOUS

## 2014-04-14 MED ORDER — NEOSTIGMINE METHYLSULFATE 10 MG/10ML IV SOLN
INTRAVENOUS | Status: AC
  Filled 2014-04-14: qty 1

## 2014-04-14 MED ORDER — BUPIVACAINE-EPINEPHRINE 0.25% -1:200000 IJ SOLN
INTRAMUSCULAR | Status: AC
  Filled 2014-04-14: qty 1

## 2014-04-14 MED ORDER — ROCURONIUM BROMIDE 100 MG/10ML IV SOLN
INTRAVENOUS | Status: AC
  Filled 2014-04-14: qty 1

## 2014-04-14 MED ORDER — FENTANYL CITRATE 0.05 MG/ML IJ SOLN
INTRAMUSCULAR | Status: AC
  Filled 2014-04-14: qty 2

## 2014-04-14 MED ORDER — HEPARIN SODIUM (PORCINE) 1000 UNIT/ML IJ SOLN
INTRAMUSCULAR | Status: AC
  Filled 2014-04-14: qty 1

## 2014-04-14 MED ORDER — CEFAZOLIN SODIUM 1-5 GM-% IV SOLN
1.0000 g | Freq: Three times a day (TID) | INTRAVENOUS | Status: DC
  Filled 2014-04-14: qty 50

## 2014-04-14 MED ORDER — DEXAMETHASONE SODIUM PHOSPHATE 10 MG/ML IJ SOLN
INTRAMUSCULAR | Status: AC
  Filled 2014-04-14: qty 1

## 2014-04-14 MED ORDER — SODIUM CHLORIDE 0.9 % IJ SOLN
INTRAMUSCULAR | Status: AC
  Filled 2014-04-14: qty 20

## 2014-04-14 MED ORDER — ONDANSETRON HCL 4 MG/2ML IJ SOLN
INTRAMUSCULAR | Status: AC
  Filled 2014-04-14: qty 2

## 2014-04-14 MED ORDER — HYDROMORPHONE HCL 2 MG/ML IJ SOLN
INTRAMUSCULAR | Status: AC
Start: 2014-04-14 — End: 2014-04-14
  Filled 2014-04-14: qty 1

## 2014-04-14 MED ORDER — PROPOFOL 10 MG/ML IV BOLUS
INTRAVENOUS | Status: AC
  Filled 2014-04-14: qty 20

## 2014-04-14 MED ORDER — GLYCOPYRROLATE 0.2 MG/ML IJ SOLN
INTRAMUSCULAR | Status: AC
  Filled 2014-04-14: qty 3

## 2014-04-14 MED ORDER — DOCUSATE SODIUM 100 MG PO CAPS
100.0000 mg | ORAL_CAPSULE | Freq: Two times a day (BID) | ORAL | Status: DC
  Administered 2014-04-14 – 2014-04-15 (×2): 100 mg via ORAL
  Filled 2014-04-14 (×3): qty 1

## 2014-04-14 MED ORDER — MIDAZOLAM HCL 2 MG/2ML IJ SOLN
INTRAMUSCULAR | Status: AC
  Filled 2014-04-14: qty 2

## 2014-04-14 MED ORDER — MORPHINE SULFATE 2 MG/ML IJ SOLN: 2.0000 mg | INTRAMUSCULAR | Status: DC | PRN

## 2014-04-14 MED ORDER — DIPHENHYDRAMINE HCL 50 MG/ML IJ SOLN: 12.5000 mg | Freq: Four times a day (QID) | INTRAMUSCULAR | Status: DC | PRN

## 2014-04-14 MED ORDER — DEXAMETHASONE SODIUM PHOSPHATE 10 MG/ML IJ SOLN
INTRAMUSCULAR | Status: DC | PRN
  Administered 2014-04-14: 10 mg via INTRAVENOUS

## 2014-04-14 MED ORDER — HYDROMORPHONE HCL 1 MG/ML IJ SOLN
INTRAMUSCULAR | Status: DC | PRN
  Administered 2014-04-14 (×4): 0.5 mg via INTRAVENOUS

## 2014-04-14 MED ORDER — INFLUENZA VAC SPLIT QUAD 0.5 ML IM SUSY
0.5000 mL | PREFILLED_SYRINGE | INTRAMUSCULAR | Status: AC
  Administered 2014-04-15: 0.5 mL via INTRAMUSCULAR
  Filled 2014-04-14 (×2): qty 0.5

## 2014-04-14 MED ORDER — HYDROCODONE-ACETAMINOPHEN 5-325 MG PO TABS: 1.0000 | ORAL_TABLET | Freq: Four times a day (QID) | ORAL | Status: DC | PRN

## 2014-04-14 MED ORDER — LIDOCAINE HCL (CARDIAC) 20 MG/ML IV SOLN
INTRAVENOUS | Status: AC
  Filled 2014-04-14: qty 5

## 2014-04-14 MED ORDER — KCL IN DEXTROSE-NACL 20-5-0.45 MEQ/L-%-% IV SOLN
INTRAVENOUS | Status: DC
  Administered 2014-04-14: 150 mL/h via INTRAVENOUS
  Administered 2014-04-15: 01:00:00 via INTRAVENOUS
  Filled 2014-04-14 (×4): qty 1000

## 2014-04-14 MED ORDER — GLYCOPYRROLATE 0.2 MG/ML IJ SOLN
INTRAMUSCULAR | Status: DC | PRN
  Administered 2014-04-14: 0.6 mg via INTRAVENOUS

## 2014-04-14 MED ORDER — BUPIVACAINE-EPINEPHRINE 0.25% -1:200000 IJ SOLN
INTRAMUSCULAR | Status: DC | PRN
  Administered 2014-04-14: 20 mL

## 2014-04-14 MED ORDER — LIDOCAINE HCL (CARDIAC) 20 MG/ML IV SOLN
INTRAVENOUS | Status: DC | PRN
  Administered 2014-04-14: 70 mg via INTRAVENOUS

## 2014-04-14 MED ORDER — CEFAZOLIN SODIUM-DEXTROSE 2-3 GM-% IV SOLR
2.0000 g | INTRAVENOUS | Status: AC
  Administered 2014-04-14: 2 g via INTRAVENOUS

## 2014-04-14 MED ORDER — LACTATED RINGERS IV SOLN
INTRAVENOUS | Status: DC
  Administered 2014-04-14: 1000 mL via INTRAVENOUS
  Administered 2014-04-14: 15:00:00 via INTRAVENOUS
  Administered 2014-04-14: 1000 mL via INTRAVENOUS
  Administered 2014-04-14: 13:00:00 via INTRAVENOUS

## 2014-04-14 MED ORDER — CIPROFLOXACIN HCL 500 MG PO TABS: 500.0000 mg | ORAL_TABLET | Freq: Two times a day (BID) | ORAL | Status: DC

## 2014-04-14 MED ORDER — EPHEDRINE SULFATE 50 MG/ML IJ SOLN
INTRAMUSCULAR | Status: DC | PRN
  Administered 2014-04-14 (×2): 5 mg via INTRAVENOUS

## 2014-04-14 MED ORDER — SUCCINYLCHOLINE CHLORIDE 20 MG/ML IJ SOLN
INTRAMUSCULAR | Status: DC | PRN
  Administered 2014-04-14: 100 mg via INTRAVENOUS

## 2014-04-14 MED ORDER — ONDANSETRON HCL 4 MG/2ML IJ SOLN: 4.0000 mg | Freq: Once | INTRAMUSCULAR | Status: DC | PRN

## 2014-04-14 MED ORDER — DOCUSATE SODIUM 100 MG PO CAPS: 100.0000 mg | ORAL_CAPSULE | Freq: Two times a day (BID) | ORAL | Status: DC

## 2014-04-14 MED ORDER — FENTANYL CITRATE 0.05 MG/ML IJ SOLN
25.0000 ug | INTRAMUSCULAR | Status: DC | PRN
  Administered 2014-04-14 (×3): 50 ug via INTRAVENOUS

## 2014-04-14 MED ORDER — ACETAMINOPHEN 325 MG PO TABS: 650.0000 mg | ORAL_TABLET | ORAL | Status: DC | PRN

## 2014-04-14 MED ORDER — CEFAZOLIN SODIUM 1-5 GM-% IV SOLN
1.0000 g | Freq: Three times a day (TID) | INTRAVENOUS | Status: AC
  Administered 2014-04-14 – 2014-04-15 (×2): 1 g via INTRAVENOUS
  Filled 2014-04-14 (×3): qty 50

## 2014-04-14 MED ORDER — NEOSTIGMINE METHYLSULFATE 10 MG/10ML IV SOLN
INTRAVENOUS | Status: DC | PRN
  Administered 2014-04-14: 4 mg via INTRAVENOUS

## 2014-04-14 SURGICAL SUPPLY — 48 items
CABLE HIGH FREQUENCY MONO STRZ (ELECTRODE) ×4 IMPLANT
CATH FOLEY 2WAY SLVR 18FR 30CC (CATHETERS) ×4 IMPLANT
CATH ROBINSON RED A/P 16FR (CATHETERS) ×4 IMPLANT
CATH ROBINSON RED A/P 8FR (CATHETERS) ×4 IMPLANT
CATH TIEMANN FOLEY 18FR 5CC (CATHETERS) ×4 IMPLANT
CHLORAPREP W/TINT 26ML (MISCELLANEOUS) ×4 IMPLANT
CLIP LIGATING HEM O LOK PURPLE (MISCELLANEOUS) ×12 IMPLANT
CLOTH BEACON ORANGE TIMEOUT ST (SAFETY) ×4 IMPLANT
COVER SURGICAL LIGHT HANDLE (MISCELLANEOUS) ×4 IMPLANT
COVER TIP SHEARS 8 DVNC (MISCELLANEOUS) ×2 IMPLANT
COVER TIP SHEARS 8MM DA VINCI (MISCELLANEOUS) ×2
CUTTER ECHEON FLEX ENDO 45 340 (ENDOMECHANICALS) ×4 IMPLANT
DECANTER SPIKE VIAL GLASS SM (MISCELLANEOUS) IMPLANT
DERMABOND ADVANCED (GAUZE/BANDAGES/DRESSINGS)
DERMABOND ADVANCED .7 DNX12 (GAUZE/BANDAGES/DRESSINGS) IMPLANT
DRAPE SURG IRRIG POUCH 19X23 (DRAPES) ×4 IMPLANT
DRSG TEGADERM 4X4.75 (GAUZE/BANDAGES/DRESSINGS) ×4 IMPLANT
DRSG TEGADERM 6X8 (GAUZE/BANDAGES/DRESSINGS) ×8 IMPLANT
ELECT REM PT RETURN 9FT ADLT (ELECTROSURGICAL) ×4
ELECTRODE REM PT RTRN 9FT ADLT (ELECTROSURGICAL) ×2 IMPLANT
GLOVE BIO SURGEON STRL SZ 6.5 (GLOVE) ×3 IMPLANT
GLOVE BIO SURGEONS STRL SZ 6.5 (GLOVE) ×1
GLOVE BIOGEL M STRL SZ7.5 (GLOVE) ×8 IMPLANT
GOWN STRL REUS W/TWL LRG LVL3 (GOWN DISPOSABLE) ×12 IMPLANT
HOLDER FOLEY CATH W/STRAP (MISCELLANEOUS) ×4 IMPLANT
IV LACTATED RINGERS 1000ML (IV SOLUTION) IMPLANT
KIT ACCESSORY DA VINCI DISP (KITS) ×2
KIT ACCESSORY DVNC DISP (KITS) ×2 IMPLANT
NDL SAFETY ECLIPSE 18X1.5 (NEEDLE) ×2 IMPLANT
NEEDLE HYPO 18GX1.5 SHARP (NEEDLE) ×2
PACK ROBOT UROLOGY CUSTOM (CUSTOM PROCEDURE TRAY) ×4 IMPLANT
RELOAD GREEN ECHELON 45 (STAPLE) ×4 IMPLANT
SET TUBE IRRIG SUCTION NO TIP (IRRIGATION / IRRIGATOR) ×4 IMPLANT
SOLUTION ELECTROLUBE (MISCELLANEOUS) ×4 IMPLANT
SUT ETHILON 3 0 PS 1 (SUTURE) ×4 IMPLANT
SUT MNCRL 3 0 RB1 (SUTURE) ×2 IMPLANT
SUT MNCRL 3 0 VIOLET RB1 (SUTURE) ×2 IMPLANT
SUT MNCRL AB 4-0 PS2 18 (SUTURE) ×8 IMPLANT
SUT MONOCRYL 3 0 RB1 (SUTURE) ×4
SUT NOVA NAB GS-22 2 0 T19 (SUTURE) ×4 IMPLANT
SUT VIC AB 0 CT1 36 (SUTURE) ×4 IMPLANT
SUT VIC AB 2-0 SH 27 (SUTURE) ×2
SUT VIC AB 2-0 SH 27X BRD (SUTURE) ×2 IMPLANT
SUT VICRYL 0 UR6 27IN ABS (SUTURE) ×8 IMPLANT
SYR 27GX1/2 1ML LL SAFETY (SYRINGE) ×4 IMPLANT
TOWEL OR 17X26 10 PK STRL BLUE (TOWEL DISPOSABLE) ×4 IMPLANT
TOWEL OR NON WOVEN STRL DISP B (DISPOSABLE) ×4 IMPLANT
WATER STERILE IRR 1500ML POUR (IV SOLUTION) IMPLANT

## 2014-04-14 NOTE — Op Note (Signed)
Preoperative diagnosis: Clinically localized adenocarcinoma of the prostate (clinical stage T3a N0 M0)  Postoperative diagnosis: Clinically localized adenocarcinoma of the prostate (clinical stage T3 N0 M0)  Procedure:  1. Robotic assisted laparoscopic radical prostatectomy (non nerve sparing) 2. Bilateral robotic assisted laparoscopic pelvic lymphadenectomy  Surgeon: Pryor Curia. M.D.  Assistant(s): Dr. Burman Nieves  Anesthesia: General  Complications: None  EBL: 50 mL  IVF:  2000 mL crystalloid  Specimens: 1. Prostate and seminal vesicles 2. Right pelvic lymph nodes 3. Left pelvic lymph nodes  Disposition of specimens: Pathology  Drains: 1. 20 Fr coude catheter 2. # 19 Blake pelvic drain  Indication: Joshua Pittman is a 73 y.o. year old patient with clinically localized prostate cancer.  After a thorough review of the management options for treatment of prostate cancer, he elected to proceed with surgical therapy and the above procedure(s).  We have discussed the potential benefits and risks of the procedure, side effects of the proposed treatment, the likelihood of the patient achieving the goals of the procedure, and any potential problems that might occur during the procedure or recuperation. Informed consent has been obtained.  Description of procedure:  The patient was taken to the operating room and a general anesthetic was administered. He was given preoperative antibiotics, placed in the dorsal lithotomy position, and prepped and draped in the usual sterile fashion. Next a preoperative timeout was performed. A urethral catheter was placed into the bladder and a site was selected near the umbilicus for placement of the camera port. This was placed using a standard open Hassan technique which allowed entry into the peritoneal cavity under direct vision and without difficulty. A 12 mm port was placed and a pneumoperitoneum established. The camera was then used  to inspect the abdomen and there was no evidence of any intra-abdominal injuries or other abnormalities. The remaining abdominal ports were then placed. 8 mm robotic ports were placed in the right lower quadrant, left lower quadrant, and far left lateral abdominal wall. A 5 mm port was placed in the right upper quadrant and a 12 mm port was placed in the right lateral abdominal wall for laparoscopic assistance. All ports were placed under direct vision without difficulty. The surgical cart was then docked.   Utilizing the cautery scissors, the bladder was reflected posteriorly allowing entry into the space of Retzius and identification of the endopelvic fascia and prostate. The periprostatic fat was then removed from the prostate allowing full exposure of the endopelvic fascia. The endopelvic fascia was then incised from the apex back to the base of the prostate bilaterally and the underlying levator muscle fibers were swept laterally off the prostate thereby isolating the dorsal venous complex. The dorsal vein was then stapled and divided with a 45 mm Flex Echelon stapler. Attention then turned to the bladder neck which was divided anteriorly thereby allowing entry into the bladder and exposure of the urethral catheter. The catheter balloon was deflated and the catheter was brought into the operative field and used to retract the prostate anteriorly. The posterior bladder neck was then examined and was divided allowing further dissection between the bladder and prostate posteriorly until the vasa deferentia and seminal vessels were identified. The vasa deferentia were isolated, divided, and lifted anteriorly. The seminal vesicles were dissected down to their tips with care to control the seminal vascular arterial blood supply. These structures were then lifted anteriorly and the space between Denonvillier's fascia and the anterior rectum was developed with a combination of  sharp and blunt dissection. This  isolated the vascular pedicles of the prostate.  A wide non nerve sparing dissection was performed with Weck clips used to ligate the vascular pedicles of the prostate bilaterally. The vascular pedicles of the prostate were then divided.  The urethra was then sharply transected allowing the prostate specimen to be disarticulated. The pelvis was copiously irrigated and hemostasis was ensured. There was no evidence for rectal injury.  Attention then turned to the right pelvic sidewall. The fibrofatty tissue between the external iliac vein, confluence of the iliac vessels, hypogastric artery, and Cooper's ligament was dissected free from the pelvic sidewall with care to preserve the obturator nerve. Weck clips were used for lymphostasis and hemostasis. An identical procedure was performed on the contralateral side and the lymphatic packets were removed for permanent pathologic analysis.  Attention then turned to the urethral anastomosis. A 2-0 Vicryl slip knot was placed between Denonvillier's fascia, the posterior bladder neck, and the posterior urethra to reapproximate these structures. A double-armed 3-0 Monocryl suture was then used to perform a 360 running tension-free anastomosis between the bladder neck and urethra. A new urethral catheter was then placed into the bladder and irrigated. There were no blood clots within the bladder and the anastomosis appeared to be watertight. A #19 Blake drain was then brought through the left lateral 8 mm port site and positioned appropriately within the pelvis. It was secured to the skin with a nylon suture. The surgical cart was then undocked. The right lateral 12 mm port site was closed at the fascial level with a 0 Vicryl suture placed laparoscopically. All remaining ports were then removed under direct vision. The prostate specimen was removed intact within the Endopouch retrieval bag via the periumbilical camera port site. This fascial opening was closed with  two running 0 Vicryl sutures. 0.25% Marcaine was then injected into all port sites and all incisions were reapproximated at the skin level with 3-0 Monocryl subcuticular sutures. Dermabond was applied to the skin. The patient appeared to tolerate the procedure well and without complications. The patient was able to be extubated and transferred to the recovery unit in satisfactory condition.  Pryor Curia MD

## 2014-04-14 NOTE — Interval H&P Note (Signed)
History and Physical Interval Note:  04/14/2014 10:28 AM  Joshua Pittman  has presented today for surgery, with the diagnosis of PROSTATE CANCER  The various methods of treatment have been discussed with the patient and family. After consideration of risks, benefits and other options for treatment, the patient has consented to  Procedure(s): ROBOTIC ASSISTED LAPAROSCOPIC RADICAL PROSTATECTOMY LEVEL 3 (N/A) LYMPHADENECTOMY (Bilateral) as a surgical intervention .  The patient's history has been reviewed, patient examined, no change in status, stable for surgery.  I have reviewed the patient's chart and labs.  Questions were answered to the patient's satisfaction.     Bertha Lokken,LES

## 2014-04-14 NOTE — Anesthesia Postprocedure Evaluation (Signed)
  Anesthesia Post-op Note  Patient: Joshua Pittman  Procedure(s) Performed: Procedure(s) (LRB): ROBOTIC ASSISTED LAPAROSCOPIC RADICAL PROSTATECTOMY LEVEL 3 (N/A) LYMPHADENECTOMY (Bilateral)  Patient Location: PACU  Anesthesia Type: General  Level of Consciousness: awake and alert   Airway and Oxygen Therapy: Patient Spontanous Breathing  Post-op Pain: mild  Post-op Assessment: Post-op Vital signs reviewed, Patient's Cardiovascular Status Stable, Respiratory Function Stable, Patent Airway and No signs of Nausea or vomiting  Last Vitals:  Filed Vitals:   04/14/14 1502  BP: 173/75  Pulse: 76  Temp: 36.7 C  Resp: 9    Post-op Vital Signs: stable   Complications: No apparent anesthesia complications

## 2014-04-14 NOTE — Discharge Instructions (Signed)

## 2014-04-14 NOTE — Progress Notes (Signed)
Patient ID: Joshua Pittman, male   DOB: 1940-10-10, 73 y.o.   MRN: [id number]  Post-op note  Subjective: The patient is doing well.  No complaints.  Objective: Vital signs in last 24 hours: Temp:  [97.6 F (36.4 C)-98.2 F (36.8 C)] 97.6 F (36.4 C) (10/26 1615) Pulse Rate:  [70-78] 73 (10/26 1615) Resp:  [8-18] 12 (10/26 1615) BP: (163-175)/(71-83) 167/73 mmHg (10/26 1615) SpO2:  [94 %-100 %] 100 % (10/26 1615) Weight:  [69.4 kg (153 lb)] 69.4 kg (153 lb) (10/26 0943)  Intake/Output from previous day:   Intake/Output this shift: Total I/O In: 3200 [I.V.:2200; IV Piggyback:1000] Out: 260 [Urine:150; Drains:60; Blood:50]  Physical Exam:  General: Alert and oriented. Abdomen: Soft, Nondistended. Incisions: Clean and dry. GU: Urine is clear.  Lab Results:  Recent Labs  04/14/14 1505  HGB 13.7  HCT 40.8    Assessment/Plan: POD#0   1) Continue to monitor, ambulate, IS   Pryor Curia. MD   LOS: 0 days   Desjuan Stearns,LES 04/14/2014, 4:35 PM

## 2014-04-14 NOTE — Transfer of Care (Signed)
Immediate Anesthesia Transfer of Care Note  Patient: Joshua Pittman  Procedure(s) Performed: Procedure(s) (LRB): ROBOTIC ASSISTED LAPAROSCOPIC RADICAL PROSTATECTOMY LEVEL 3 (N/A) LYMPHADENECTOMY (Bilateral)  Patient Location: PACU  Anesthesia Type: General  Level of Consciousness: sedated, patient cooperative and responds to stimulation  Airway & Oxygen Therapy: Patient Spontanous Breathing and Patient connected to face mask oxgen  Post-op Assessment: Report given to PACU RN and Post -op Vital signs reviewed and stable  Post vital signs: Reviewed and stable  Complications: No apparent anesthesia complications

## 2014-04-15 ENCOUNTER — Encounter (HOSPITAL_COMMUNITY): Payer: Self-pay | Admitting: Urology

## 2014-04-15 LAB — HEMOGLOBIN AND HEMATOCRIT, BLOOD
HCT: 36.1 % — ABNORMAL LOW (ref 39.0–52.0)
HEMOGLOBIN: 12.2 g/dL — AB (ref 13.0–17.0)

## 2014-04-15 MED ORDER — HYDROCODONE-ACETAMINOPHEN 5-325 MG PO TABS: 1.0000 | ORAL_TABLET | Freq: Four times a day (QID) | ORAL | Status: DC | PRN

## 2014-04-15 MED ORDER — BISACODYL 10 MG RE SUPP
10.0000 mg | Freq: Once | RECTAL | Status: DC
  Filled 2014-04-15: qty 1

## 2014-04-15 NOTE — Progress Notes (Signed)
Patient ID: Joshua Pittman, male   DOB: [birth year], 73 y.o.   MRN: [id number]  1 Day Post-Op Subjective: The patient is doing well.  No nausea or vomiting. Pain is adequately controlled.  Objective: Vital signs in last 24 hours: Temp:  [97.6 F (36.4 C)-99 F (37.2 C)] 99 F (37.2 C) (10/27 0400) Pulse Rate:  [56-78] 56 (10/27 0400) Resp:  [8-18] 18 (10/27 0400) BP: (126-175)/(56-83) 126/56 mmHg (10/27 0400) SpO2:  [94 %-100 %] 100 % (10/27 0400) Weight:  [69.4 kg (153 lb)] 69.4 kg (153 lb) (10/26 0943)  Intake/Output from previous day: 10/26 0701 - 10/27 0700 In: 3200 [I.V.:2200; IV Piggyback:1000] Out: [place] [Urine:1625; Drains:160; Blood:50] Intake/Output this shift:    Physical Exam:  General: Alert and oriented. CV: RRR Lungs: Clear bilaterally. GI: Soft, Nondistended. Incisions: Clean, dry, and intact Urine: Clear Extremities: Nontender, no erythema, no edema.  Lab Results:  Recent Labs  04/14/14 1505 04/15/14 0416  HGB 13.7 12.2*  HCT 40.8 36.1*      Assessment/Plan: POD# 1 s/p robotic prostatectomy.  1) SL IVF 2) Ambulate, Incentive spirometry 3) Transition to oral pain medication 4) Dulcolax suppository 5) D/C pelvic drain 6) Plan for likely discharge later today   Pryor Curia. MD   LOS: 1 day   Umi Mainor,LES 04/15/2014, 8:50 AM

## 2014-04-15 NOTE — Discharge Summary (Signed)
  Date of admission: 04/14/2014  Date of discharge: 04/15/2014  Admission diagnosis: Prostate Cancer  Discharge diagnosis: Prostate Cancer  History and Physical: For full details, please see admission history and physical. Briefly, Joshua Pittman is a 73 y.o. gentleman with localized prostate cancer.  After discussing management/treatment options, he elected to proceed with surgical treatment.  Hospital Course: Yonas Rojas-Piloto was taken to the operating room on 04/14/2014 and underwent a robotic assisted laparoscopic radical prostatectomy. He tolerated this procedure well and without complications. Postoperatively, he was able to be transferred to a regular hospital room following recovery from anesthesia.  He was able to begin ambulating the night of surgery. He remained hemodynamically stable overnight.  He had excellent urine output with appropriately minimal output from his pelvic drain and his pelvic drain was removed on POD #1.  He was transitioned to oral pain medication, tolerated a clear liquid diet, and had met all discharge criteria and was able to be discharged home later on POD#1.  Laboratory values:  Recent Labs  04/14/14 1505 04/15/14 0416  HGB 13.7 12.2*  HCT 40.8 36.1*    Disposition: Home  Discharge instruction: He was instructed to be ambulatory but to refrain from heavy lifting, strenuous activity, or driving. He was instructed on urethral catheter care.  Discharge medications:     Medication List         ciprofloxacin 500 MG tablet  Commonly known as:  CIPRO  Take 1 tablet (500 mg total) by mouth 2 (two) times daily. Begin one day prior to return office visit for catheter removal.     docusate sodium 100 MG capsule  Commonly known as:  COLACE  Take 1 capsule (100 mg total) by mouth 2 (two) times daily.     HYDROcodone-acetaminophen 5-325 MG per tablet  Commonly known as:  NORCO/VICODIN  Take 1-2 tablets by mouth every 6 (six) hours as needed.        Followup: He will followup in 1 week for catheter removal and to discuss his surgical pathology results.

## 2014-07-21 ENCOUNTER — Encounter: Payer: Self-pay | Admitting: Medical Oncology

## 2014-07-21 NOTE — [facility] Oncology Navigator Note (Signed)
I called Mr. Bedel with a spanish interpreter via [employer].  Pt states he is at work and can we call back later. He gave [place] his mother's telephone number and he asked [place] to call her.The number he gave [place] was out of service.Pt was seen in the [facility] 03/28/14 and I wanted to notify him, I was mailing him another set of medical forms to update and bring to his appointment. The appointment for the clinic 2/12 with arrival at  7:45am  was called to a family member by Earleen Newport and confirmed.  I was try to reach the patient later this afternoon to inform him of the forms. I will also call him 2/11 to remind him if his appointment for the [facility].  Cira Rue, RN, BSN, [place]  [phone] Fax [phone]

## 2014-07-29 ENCOUNTER — Encounter: Payer: Self-pay | Admitting: Radiation Oncology

## 2014-07-29 NOTE — Progress Notes (Signed)
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 York Ram to Dr. Louis Meckel August 2015 for elevated PSA  Biopsies of radical prostate resection (if applicable) revealed:  FINAL DIAGNOSIS Diagnosis 1. 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 - OTHER RESECTION MARGINS, NEGATIVE FOR ATYPIA OR MALIGNANCY. 2. Lymph nodes, regional resection, right - ONE OF SEVEN LYMPH NODES, POSITIVE FOR METASTATIC CARCINOMA (1/7). 3. Lymph nodes, regional resection, left - FOUR LYMPH NODES, NEGATIVE FOR METASTATIC CARCINOMA (0/4). Microscopic Comment 1. PROSTATE RADICAL RESECTION/PROSTATECTOMY Procedure: Prostatectomy. Prostate gland: Weight: 59 grams. Dimension: 5 cm from right to left, 4.4 cm from apex to base, and 4.5 cm from anterior to posterior  Past/Anticipated interventions by urology, if any: prostate biopsy, prostatectomy and referral back to Dr. Tammi Klippel  Past/Anticipated interventions by medical oncology, if any: no  Weight changes, if any: no  Bowel/Bladder complaints, if any: minimal leakage - wears on pad per day   Nausea/Vomiting, if any: no  Pain issues, if any:  no  SAFETY ISSUES:  Prior radiation? no  Pacemaker/ICD? no  Possible current pregnancy? no  Is the patient on methotrexate? no  Current Complaints / other details:  74 year old male. From [place]. NKDA.

## 2014-07-31 ENCOUNTER — Telehealth: Payer: Self-pay | Admitting: Medical Oncology

## 2014-07-31 NOTE — Telephone Encounter (Signed)
Art [phone] [place] called Joshua Pittman to confirm Joshua appointment for 2/10 at the [facility] arriving at 7:45am. Joshua Pittman confirmed the appointment. Pt and Joshua Pittman have been to the cancer center so they are aware of our location. I asked them to call me if any questions or concerns. I also confirmed an interpreter for the appointment here at [facility].    Joshua Rue, RN, BSN, [place]  [phone] Fax [phone]

## 2014-08-01 ENCOUNTER — Encounter: Payer: Self-pay | Admitting: Medical Oncology

## 2014-08-01 ENCOUNTER — Encounter: Payer: Self-pay | Admitting: *Deleted

## 2014-08-01 ENCOUNTER — Encounter: Payer: Self-pay | Admitting: Radiation Oncology

## 2014-08-01 ENCOUNTER — Ambulatory Visit
Admission: RE | Admit: 2014-08-01 | Discharge: 2014-08-01 | Disposition: A | Discharge status: Home / Self Care | Payer: No Typology Code available for payment source | Source: Ambulatory Visit | Attending: Radiation Oncology | Admitting: Radiation Oncology

## 2014-08-01 ENCOUNTER — Ambulatory Visit (HOSPITAL_BASED_OUTPATIENT_CLINIC_OR_DEPARTMENT_OTHER): Payer: No Typology Code available for payment source | Admitting: Oncology

## 2014-08-01 VITALS — BP 181/67 | HR 63 | Resp 16 | Ht 68.9 in | Wt 156.1 lb

## 2014-08-01 DIAGNOSIS — N529 Male erectile dysfunction, unspecified: Secondary | ICD-10-CM | POA: Insufficient documentation

## 2014-08-01 DIAGNOSIS — C61 Malignant neoplasm of prostate: Secondary | ICD-10-CM | POA: Diagnosis present

## 2014-08-01 DIAGNOSIS — R32 Unspecified urinary incontinence: Secondary | ICD-10-CM | POA: Diagnosis not present

## 2014-08-01 DIAGNOSIS — Z9079 Acquired absence of other genital organ(s): Secondary | ICD-10-CM | POA: Diagnosis not present

## 2014-08-01 NOTE — Progress Notes (Signed)
74 year old male. Retired. Married. Wife, Joycelyn Das. Two sons, Randall Hiss and Advance. Patient denies having a colonscopy. Patient denies performing routine self testicular exams.Reports smoking three cigars per day.

## 2014-08-01 NOTE — Addendum Note (Signed)
Encounter addended by: Heywood Footman, RN on: 08/01/2014 12:27 PM<BR>     Documentation filed: Medications, Charges VN, Flowsheet VN, Vitals Section, Notes Section

## 2014-08-01 NOTE — Addendum Note (Signed)
Encounter addended by: Lora Paula, MD on: 08/01/2014 10:25 AM<BR>     Documentation filed: Follow-up Section, LOS Section

## 2014-08-01 NOTE — Progress Notes (Signed)
[place] Work  Clinical Social Work met with pt and son, aided by [place] interpreter at [facility] to review distress screening protocol, explain role of CSW and resources of Pt and Family Support Team.  CSW had met with pt last fall when he came to a previous Prostate Clinic. Pt and his wife do not drive and rely on family for transportation. His son drove him today and can assist with transportation as needed. CSW reviewed other options if transportation does become a concern. Son reports he can speak [place] and can reach out for assistance if needed.   The patient scored a 0 on the Psychosocial Distress Thermometer which indicates no distress. Pt reports he has had time to process his cancer as he was diagnosed last fall. He shared at times he does feel some anxiety, but overall feels no distress at this time. Clinical Social Worker reviewed how this could possibly change and pt agrees to reach out to CSW as needed.   Loren Racer, [place] Worker [place]  [place] Phone: [phone] Fax: [phone]

## 2014-08-01 NOTE — Progress Notes (Signed)
Hematology and Oncology Follow Up Visit  Joshua Pittman [id number] 09-26-1940 74 y.o. 08/01/2014 9:31 AM No primary care provider on file.No ref. provider found   Principle Diagnosis: 74 year old with prostate cancer diagnosed in 01/2014. Gleason score 4+3= 7, PSA 62.82.   Prior Therapy: S/P Radical prostatectomy on 04/14/2014. The pathology showed T3a N1 (1/11 lymph nodes involved).   Current therapy: Under consideration for adjuvant treatment    Interim History:   Joshua Pittman presents for a follow up visit at the prostate cancer [place]. He underwent RP without any complications. He tolerated the operation and have recovered fully. He is eating well and have resumed all activity of daily living.   Clinically, he feels relatively well and reports no major constitutional symptoms. He does not report any fevers or chills or sweats. He reports no chest pain or shortness of breath. He reports no nausea or vomiting or abdominal pain. He does not report any skeletal complaints or arthralgias or myalgias. Continues to be in excellent performance status and the rest of review of systems unremarkable.  Medications: I have reviewed the patient's current medications.  Current Outpatient Prescriptions  Medication Sig Dispense Refill  . ciprofloxacin (CIPRO) 500 MG tablet Take 1 tablet (500 mg total) by mouth 2 (two) times daily. Begin one day prior to return office visit for catheter removal. 6 tablet 0  . docusate sodium (COLACE) 100 MG capsule Take 1 capsule (100 mg total) by mouth 2 (two) times daily. 30 capsule 0  . HYDROcodone-acetaminophen (NORCO/VICODIN) 5-325 MG per tablet Take 1-2 tablets by mouth every 6 (six) hours as needed. 25 tablet 0  . Misc Natural Products (PROSTATE SUPPORT) 300-15 MG TABS Take by mouth.     No current facility-administered medications for this visit.     Allergies: No Known Allergies  Past Medical History, Surgical history, Social history, and Family  History were reviewed and updated.   Physical Exam:  ECOG: 0 General appearance: alert and cooperative Head: Normocephalic, without obvious abnormality Neck: no adenopathy, no carotid bruit, no JVD and supple, symmetrical, trachea midline Lymph nodes: Cervical, supraclavicular, and axillary nodes normal. Heart:regular rate and rhythm, S1, S2 normal, no murmur, click, rub or gallop Lung:chest clear, no wheezing, rales, normal symmetric air entry Abdomin: soft, non-tender, without masses or organomegaly EXT:no erythema, induration, or nodules   Lab Results: Lab Results  Component Value Date   WBC 6.4 04/09/2014   HGB 12.2* 04/15/2014   HCT 36.1* 04/15/2014   MCV 92.6 04/09/2014   PLT 300 04/09/2014     Chemistry      Component Value Date/Time   NA 139 04/09/2014 1425   K 4.6 04/09/2014 1425   CL 101 04/09/2014 1425   CO2 26 04/09/2014 1425   BUN 15 04/09/2014 1425   CREATININE 0.88 04/09/2014 1425      Component Value Date/Time   CALCIUM 9.5 04/09/2014 1425      Impression and Plan:   74 year old prostate cancer diagnosed in 01/2014. He is S/P RP with the pathology showed Gleason score 4+ 3= 7 and 1 out of 11 lymph nodes were involved.  Margins were not involved. His post op PSA was down to 0.27 in 05/2014 and 0.29 on 07/21/2014. His case was discussed again in the [facility] and felt he would benefit from adjuvant radiation therapy with short term of traditional ADT. The duration of ADT would be 9 to 12 months given his positive lymph node. Complication of hormone therapy discussed  today and that include hot flashes, weight gain, ED , etc..He is agreeable to proceed.   [facility], MD 2/12/20169:31 AM

## 2014-08-01 NOTE — [facility] Oncology Navigator Note (Signed)
                               Care Plan Summary  Name: Joshua Pittman DOB: Oct 04, 1940   Your Medical Team:   Urologist -  Dr. [name], [facility]  Radiation Oncologist - Dr.Matthew Micki [facility]   Medical Oncologist - Dr. Zola Button, [place]  Recommendations: 1) Hormone Therapy  2) Radiation    * These recommendations are based on information available as of today's consult.      Recommendations may change depending on the results of further tests or exams.    Next Steps: 1) Dr. Lynne Logan office will call you to set up appointment for hormone injections. 2) Dr. Johny Shears office will call you with radiation appointments.  When appointments need to be scheduled, you will be contacted by [facility] and/or [facility].  Questions?  Please do not hesitate to call Joshua Rue, Joshua Pittman, Joshua Pittman, Joshua Pittman at [phone] any questions or concerns.  Joshua Pittman is your Oncology Nurse Navigator and is available to assist you while you're receiving your medical care at [facility].

## 2014-08-01 NOTE — Addendum Note (Signed)
Encounter addended by: [name], MD on: 08/01/2014 11:42 AM<BR>     Documentation filed: Clinical Notes

## 2014-08-01 NOTE — Progress Notes (Signed)
  Radiation Oncology         [phone] ________________________________  Name: Joshua Pittman MRN: [id number]  Date: 08/01/2014  DOB: [birth year]  Follow-Up Visit Note  CC: No primary care provider on file.  [name], MD  Diagnosis:   74 yo man with pT3 N1 M0 adenocarcinoma of the prostate with a Gleason's score of 4+3 and post-op PSA of 0.27    ICD-9-CM ICD-10-CM   1. Malignant neoplasm of prostate 185 C61    Narrative:  I saw this patient in consult on 03/28/14, and he later underwent prostatectomy on 04/14/14 revealing:                                 Post-operatively, on 05/30/14 PSA was 0.27 and on 2/2 is 0.29  He is recovering is continence and presents today for multidisciplinary conference.   ALLERGIES:  has No Known Allergies.  Meds: Current Outpatient Prescriptions  Medication Sig Dispense Refill  . ciprofloxacin (CIPRO) 500 MG tablet Take 1 tablet (500 mg total) by mouth 2 (two) times daily. Begin one day prior to return office visit for catheter removal. 6 tablet 0  . docusate sodium (COLACE) 100 MG capsule Take 1 capsule (100 mg total) by mouth 2 (two) times daily. 30 capsule 0  . HYDROcodone-acetaminophen (NORCO/VICODIN) 5-325 MG per tablet Take 1-2 tablets by mouth every 6 (six) hours as needed. 25 tablet 0  . Misc Natural Products (PROSTATE SUPPORT) 300-15 MG TABS Take by mouth.     No current facility-administered medications for this encounter.    Physical Findings: The patient is in no acute distress. Patient is alert and oriented. No significant changes.  Lab Findings: Lab Results  Component Value Date   WBC 6.4 04/09/2014   HGB 12.2* 04/15/2014   HCT 36.1* 04/15/2014   MCV 92.6 04/09/2014   PLT 300 04/09/2014    Impression:  The patient is recovering from surgery.  He is at high risk for recurrence and has detectable PSA.  He would likely benefit from prostatic fossa and pelvic node irradiation in conjunction with androgen  deprivation.  Plan:  Today, I talked to the patient and family about the findings and work-up thus far.  We discussed the natural history of high risk biochemically recurrent prostate cancer and general treatment, highlighting the role of radiotherapy in the management.  We discussed the available radiation techniques, and focused on the details of logistics and delivery.  We reviewed the anticipated acute and late sequelae associated with radiation in this setting.  The patient was encouraged to ask questions that I answered to the best of my ability.  I filled out a patient counseling form during our discussion including treatment diagrams.  We retained a copy for our records.  The patient would like to proceed with radiation and will be scheduled for CT simulation in early April after two months of ADT and continued pelvic floor exercises.  I spent 60 minutes minutes face to face with the patient and more than 50% of that time was spent in counseling and/or coordination of care.   _____________________________________  Sheral Apley. Tammi Klippel, M.D.

## 2014-08-01 NOTE — Consult Note (Signed)
Reason For Visit  Reason for visit: To discuss options for management of high risk prostate cancer. Location of consult: [place]   History of Present Illness  [name] is a 79 year with prostate cancer s/p a NNS RAL radical prostatectomy and BPLND on 04/14/14.  Diagnosis: pT3a N1 Mx, Gleason 4+3=7 adenocarcinoma with a focal positive margin at the left apex (1/11 LN positive) Pretreatment PSA: 62.82 Pretreatment SHIM: 6  Interval history:  He follows up today for further evaluation of this lymph node positive and locally advanced prostate cancer.  He follows up today after his most recent PSA.  He continues to see improvement with regard to continence and is typically using 1 pad per day with fairly minimal incontinence.  He otherwise is resuming all normal activities of daily living.  His visit today is performed in the company of his nephew and a Spanish interpreter.     Past Medical History  1. History of No significant past medical history  Surgical History  1. History of Inguinal Hernia Repair  2. History of Laparoscopy With Bilateral Total Pelvic Lymphadenectomy  3. History of Prostatect Retropubic Radical W/ Nerve Sparing Laparoscopic  Current Meds  1. Prostate Support TABS;  Therapy: (Recorded:20Aug2015) to Recorded  Allergies  1. No Known Drug Allergies  Family History  1. Family history of Death of family member : Mother, Father  2. Family history of asthma (Z82.5) : Mother  Social History   Alcohol use (Z78.9)   Current every day smoker (F51.200)   Married   Retired  Physical Exam Constitutional: Well nourished and well developed . No acute distress.    Results/Data Selected Results  PSA [id number] 09:40AM [name]  SPECIMEN TYPE: BLOOD   Test Name Result Flag Reference  PSA 0.29 ng/mL  <=4.00  TEST METHODOLOGY: ECLIA PSA (ELECTROCHEMILUMINESCENCE IMMUNOASSAY)   PSA [id number] 09:31AM [name]  SPECIMEN TYPE:  BLOOD   Test Name Result Flag Reference  PSA 0.27 ng/mL  <=4.00  RESULT REPEATED AND VERIFIED. TEST METHODOLOGY: ECLIA PSA (ELECTROCHEMILUMINESCENCE IMMUNOASSAY)   Assessment  1. Prostate cancer (C61)  Discussion/Summary  1.  Lymph node positive cancer: We have reviewed his PSA result today which fortunately has not significantly risen since his last visit.  Considering his very low PSA and his pathologic parameters from his surgical specimen, he remains at high risk for having residual disease.  Considering his low PSA and his limited lymph node involvement, there is a certain possibility that his persistent disease is located within the pelvis.  We therefore have discussed proceeding with additional therapy in the form of external beam radiation therapy to the prostate bed and pelvic lymph nodes.  We also discussed the option of proceeding with concomitant androgen deprivation therapy based on some of the recent data suggesting potential benefit.  We have reviewed the potential side effects and potential complications related to these therapies in the context of the potential benefit of reducing the risk for recurrence and improvement and long-term cancer control.  He does wish to proceed with radiation therapy and androgen deprivation therapy.We discussed the risks and benefits of androgen deprivation therapy. Side effects of prolonged use of androgen deprivation were discussed including hot flashes, fracture and depletion of bone mineral density, weight gain, decreased libido, cardiovascular risks such as increased risk of diabetes, abdominal girth, elevated cholesterol, and possibly cardiovascular events such as heart attack or stroke. We discussed intermittent androgen deprivation as an alternative approach to continuous therapy as  well as combined androgen blockade.  It was explained that intermittent therapy has not been shown to improve survival but in certain populations of men has demonstrated  similar survival rates with improved quality of life outcomes. It was also explained that combined androgen blockade may provide a marginal survival benefit as compare with sequential use of anti-androgens but with the risks of additional mild toxicities and increased cost.  He will begin androgen deprivation therapy in the near future and will be scheduled by Dr. Tammi Klippel to proceed with radiation therapy and likely will be given some additional time to see further improvement in his continence.  He will also be scheduled for his next PSA in approximately 6 months at which time he would be due for his next androgen deprivation injection and will likely have completed radiation therapy.  2.  Incontinence: He will continue his pelvic floor muscle training.  3.  Erectile dysfunction: This has been a low priority and we will discuss this in the future if he desires.   Cc: Dr. Zola Button Dr. Tyler Pita Dr. Burman Nieves Dr. York Ram   A total of 25 minutes were spent in the overall care of the patient today with 25 minutes in direct face to face consultation.    Signatures Electronically signed by : [name], M.D.; Aug 01 2014 11:41AM EST

## 2014-08-05 ENCOUNTER — Telehealth: Payer: Self-pay | Admitting: Medical Oncology

## 2014-08-05 NOTE — Telephone Encounter (Signed)
Pt was seen Friday in the [facility].  It was recommended that pt start androgen deprivation therapy (ADT) and radiation. Just before pt left the clinic he voiced he needs to go to [place] for about 4 days for some business. He asked if it would be ok if he goes before he starts his treatments. Per Dr. Alinda Money and Dr. Tammi Klippel he can start his ADT now  and when he returns we can set him up for radiation. I spoke with Sherrell Puller his grandson and he will inform the patient. I explained that Dr. Lynne Logan office will call him with the appointment to start his ADT and I requested they call us when pt returns from [place] and we can get his scheduled for his radiation. He voiced understanding.

## 2014-08-19 ENCOUNTER — Telehealth: Payer: Self-pay | Admitting: *Deleted

## 2014-08-19 NOTE — Telephone Encounter (Signed)
Called patient to inform of sim on 09-26-14 @ 2 pm @ Dr. Johny Shears Office, lvm for a return call

## 2014-09-26 ENCOUNTER — Ambulatory Visit
Admission: RE | Admit: 2014-09-26 | Discharge: 2014-09-26 | Disposition: A | Discharge status: Home / Self Care | Payer: No Typology Code available for payment source | Source: Ambulatory Visit | Attending: Radiation Oncology | Admitting: Radiation Oncology

## 2014-09-26 ENCOUNTER — Other Ambulatory Visit: Payer: Self-pay | Admitting: Radiation Oncology

## 2014-09-26 DIAGNOSIS — C61 Malignant neoplasm of prostate: Secondary | ICD-10-CM | POA: Insufficient documentation

## 2014-09-26 NOTE — Progress Notes (Signed)
  Radiation Oncology         [phone] ________________________________  Name: Joshua Pittman MRN: [id number]  Date: 09/26/2014  DOB: 06-Jan-1941  SIMULATION AND TREATMENT PLANNING NOTE    ICD-9-CM ICD-10-CM   1. Malignant neoplasm of prostate 185 C61     DIAGNOSIS:  74 yo man with pT3 N1 M0 adenocarcinoma of the prostate with a Gleason's score of 4+3 and post-op PSA of 0.27  NARRATIVE:   No show, to be rescheduled  ________________________________  Sheral Apley Tammi Klippel, M.D.

## 2014-09-30 ENCOUNTER — Telehealth: Payer: Self-pay | Admitting: Medical Oncology

## 2014-09-30 ENCOUNTER — Telehealth: Payer: Self-pay | Admitting: *Deleted

## 2014-09-30 NOTE — Telephone Encounter (Signed)
CALLED PATIENT TO INFORM OF SIM ON 10-03-14 @ 2:30 PM, LVM FOR A RETURN CALL

## 2014-09-30 NOTE — Telephone Encounter (Signed)
I called and left patient's grandson Sherrell Puller a message to call me regarding  missed simulation appointment 09/26/14.

## 2014-09-30 NOTE — Telephone Encounter (Signed)
Patient's grandson Joshua Pittman called back inquiring about the message I left. Patient was not aware that he had an appointment for the simulation. I explained that a message was left and it may not have been in [place]. I asked if he would be available for [facility] to call and reschedule he appointment. I explained the day of simulation we will have an interpretor here to explain to the patient the procedure and then he will be given a calender of treatment dates and time. He voiced understanding. I spoke with Enid Derry in radiation and she will call Ramsey.    Cira Rue, RN, BSN, [place]  [phone] Fax [phone]

## 2014-10-02 ENCOUNTER — Telehealth: Payer: Self-pay | Admitting: Medical Oncology

## 2014-10-02 NOTE — Telephone Encounter (Signed)
I spoke with Sherrell Puller- grandson of patient to confirm his appointment for simulation. I explained to him where patient registers and how long they will be here. I have scheduled an interpreter for this appointment. He voiced understanding.

## 2014-10-02 NOTE — Telephone Encounter (Signed)
I spoke with Joshua Pittman to verify Mr. Treat has an interpreter scheduled for his appointment tomorrow in radiation. She has scheduled Almyra Free, our in house interpreter. I have notified Enid Derry in radiation that I have verified appointment with patient and the above.

## 2014-10-03 ENCOUNTER — Ambulatory Visit: Payer: No Typology Code available for payment source | Admitting: Radiation Oncology

## 2014-10-08 ENCOUNTER — Telehealth: Payer: Self-pay | Admitting: Medical Oncology

## 2014-10-08 NOTE — Telephone Encounter (Signed)
I spoke with Ramsey-grandson to inform him that we need to reschedule his CT sim appointment tomorrow due to the MD's schedule. His new appointment is Monday April 25 at 9:00am. He voiced understanding and will notify his grandfather. I will arrange for an interpreter for this appointment.    Cira Rue, RN, BSN, [place]  [phone] Fax [phone]

## 2014-10-09 ENCOUNTER — Ambulatory Visit: Payer: No Typology Code available for payment source | Admitting: Radiation Oncology

## 2014-10-10 NOTE — Progress Notes (Signed)
  Radiation Oncology         [phone] ________________________________  Name: Joshua Pittman MRN: [id number]  Date: 10/13/2014  DOB: January 13, 1941  SIMULATION AND TREATMENT PLANNING NOTE    ICD-9-CM ICD-10-CM   1. Malignant neoplasm of prostate 185 C61     DIAGNOSIS: 74 yo man with pT3 N1 M0 adenocarcinoma of the prostate with a Gleason's score of 4+3 and post-op PSA of 0.27  NARRATIVE:  The patient was brought to the [place].  Identity was confirmed.  All relevant records and images related to the planned course of therapy were reviewed.  The patient freely provided informed written consent to proceed with treatment after reviewing the details related to the planned course of therapy. The consent form was witnessed and verified by the simulation staff.  Then, the patient was set-up in a stable reproducible supine position for radiation therapy.  A vacuum lock pillow device was custom fabricated to position his legs in a reproducible immobilized position.  Then, I performed a urethrogram under sterile conditions to identify the prostatic apex.  CT images were obtained.  Surface markings were placed.  The CT images were loaded into the planning software.  Then the prostate target and avoidance structures including the rectum, bladder, bowel and hips were contoured.  Treatment planning then occurred.  The radiation prescription was entered and confirmed.  A total of one complex treatment device was fabricated. I have requested : Intensity Modulated Radiotherapy (IMRT) is medically necessary for this case for the following reason:  Rectal sparing.Marland Kitchen  PLAN:  The patient will receive 68.4 Gy in 38 fractions.  This document serves as a record of services personally performed by Tyler Pita, MD. It was created on his behalf by Arlyce Harman, a trained medical scribe. The creation of this record is based on the scribe's personal observations and the provider's statements  to them. This document has been checked and approved by the attending provider.     ________________________________  Sheral Apley. Tammi Klippel, M.D.

## 2014-10-13 ENCOUNTER — Ambulatory Visit
Admission: RE | Admit: 2014-10-13 | Discharge: 2014-10-13 | Disposition: A | Discharge status: Home / Self Care | Payer: No Typology Code available for payment source | Source: Ambulatory Visit | Attending: Radiation Oncology | Admitting: Radiation Oncology

## 2014-10-13 ENCOUNTER — Encounter: Payer: Self-pay | Admitting: Medical Oncology

## 2014-10-13 DIAGNOSIS — C61 Malignant neoplasm of prostate: Secondary | ICD-10-CM | POA: Diagnosis present

## 2014-10-13 NOTE — [facility] Oncology Navigator Note (Signed)
Joshua Pittman here today for simulation. I introduced Joshua Pittman- interpreter to patient and [name]. Patient tolerated the procedure well and stated it was a piece of cake. We toured the radiation department with Joshua Pittman and explained how he will check in and where he will dress and receive treatments. He was given a calender and this was reviewed with patient and grandson. All questions were addressed. Patient has my business card and I asked that he/ Joshua Pittman call with any questions or concern. They voiced understanding.  Patient will begin radiation May 4.    Cira Rue, RN, BSN, [place]  [phone] Fax [phone]

## 2014-10-21 DIAGNOSIS — C61 Malignant neoplasm of prostate: Secondary | ICD-10-CM | POA: Diagnosis not present

## 2014-10-22 ENCOUNTER — Ambulatory Visit
Admission: RE | Admit: 2014-10-22 | Discharge: 2014-10-22 | Disposition: A | Discharge status: Home / Self Care | Payer: No Typology Code available for payment source | Source: Ambulatory Visit | Attending: Radiation Oncology | Admitting: Radiation Oncology

## 2014-10-22 ENCOUNTER — Encounter: Payer: Self-pay | Admitting: Medical Oncology

## 2014-10-22 DIAGNOSIS — C61 Malignant neoplasm of prostate: Secondary | ICD-10-CM | POA: Diagnosis not present

## 2014-10-22 NOTE — [facility] Oncology Navigator Note (Signed)
[name] had his first radiation treatment today. Per Joshua Pittman- interpreter pt states it went excellent. Joshua Pittman his grandson is with him. I asked them to call if any questions or concerns.     Cira Rue, RN, BSN, [place]  [phone] Fax [phone]

## 2014-10-23 ENCOUNTER — Ambulatory Visit
Admission: RE | Admit: 2014-10-23 | Discharge: 2014-10-23 | Disposition: A | Discharge status: Home / Self Care | Payer: No Typology Code available for payment source | Source: Ambulatory Visit | Attending: Radiation Oncology | Admitting: Radiation Oncology

## 2014-10-23 DIAGNOSIS — C61 Malignant neoplasm of prostate: Secondary | ICD-10-CM | POA: Diagnosis not present

## 2014-10-24 ENCOUNTER — Encounter: Payer: Self-pay | Admitting: Radiation Oncology

## 2014-10-24 ENCOUNTER — Ambulatory Visit
Admission: RE | Admit: 2014-10-24 | Discharge: 2014-10-24 | Disposition: A | Discharge status: Home / Self Care | Payer: No Typology Code available for payment source | Source: Ambulatory Visit | Attending: Radiation Oncology | Admitting: Radiation Oncology

## 2014-10-24 VITALS — BP 168/74 | HR 59 | Resp 16 | Wt 156.6 lb

## 2014-10-24 DIAGNOSIS — C61 Malignant neoplasm of prostate: Secondary | ICD-10-CM | POA: Diagnosis not present

## 2014-10-24 NOTE — Addendum Note (Signed)
Encounter addended by: Heywood Footman, RN on: 10/24/2014 10:04 AM<BR>     Documentation filed: Inpatient Patient Education, Notes Section, Chief Complaint Section

## 2014-10-24 NOTE — Progress Notes (Signed)
Post sim education completed. Oriented patient to staff and routine of the clinic. Provided patient with RADIATION THERAPY AND YOU handbook then, reviewed pertinent information. Educated patient reference potential side effects and management such as fatigue, diarrhea and urinary/bladder changes. Patient understands to call [phone] with needs. Patient verbalized understanding of all reviewed.

## 2014-10-24 NOTE — Progress Notes (Signed)
  Radiation Oncology         [phone] ________________________________  Name: Joshua Pittman MRN: [id number]  Date: 10/24/2014  DOB: 1940-09-14  Weekly Radiation Therapy Management    ICD-9-CM ICD-10-CM   1. 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 . . . . . . . The patient presents for routine under treatment assessment. Weight and vitals stable. Denies pain. Reports intermittent mild dysuria with urination. Denies hematuria. Reports nocturia x1. Denies fatigue Set-up films were reviewed. The chart was checked.  Physical Findings. . .  weight is 156 lb 9.6 oz (71.033 kg). His blood pressure is 168/74 and his pulse is 59. His respiration is 16. . Weight essentially stable.  No significant changes.  Impression . . . . . . . The patient is tolerating radiation.  Plan . . . . . . . . . . . . Continue treatment as planned. Once a week.  This document serves as a record of services personally performed by Tyler Pita, MD. It was created on his behalf by Jeralene Peters, a trained medical scribe. The creation of this record is based on the scribe's personal observations and the provider's statements to them. This document has been checked and approved by the attending provider.     ________________________________  Sheral Apley. Tammi Klippel, M.D.

## 2014-10-24 NOTE — Progress Notes (Signed)
Weight and vitals stable. Denies pain. Reports intermittent mild dysuria with urination. Denies hematuria. Reports nocturia x1. Denies fatigue.

## 2014-10-27 ENCOUNTER — Ambulatory Visit
Admission: RE | Admit: 2014-10-27 | Discharge: 2014-10-27 | Disposition: A | Discharge status: Home / Self Care | Payer: No Typology Code available for payment source | Source: Ambulatory Visit | Attending: Radiation Oncology | Admitting: Radiation Oncology

## 2014-10-27 DIAGNOSIS — C61 Malignant neoplasm of prostate: Secondary | ICD-10-CM | POA: Diagnosis not present

## 2014-10-28 ENCOUNTER — Ambulatory Visit
Admission: RE | Admit: 2014-10-28 | Discharge: 2014-10-28 | Disposition: A | Discharge status: Home / Self Care | Payer: No Typology Code available for payment source | Source: Ambulatory Visit | Attending: Radiation Oncology | Admitting: Radiation Oncology

## 2014-10-28 DIAGNOSIS — C61 Malignant neoplasm of prostate: Secondary | ICD-10-CM | POA: Diagnosis not present

## 2014-10-29 ENCOUNTER — Ambulatory Visit
Admission: RE | Admit: 2014-10-29 | Discharge: 2014-10-29 | Disposition: A | Discharge status: Home / Self Care | Payer: No Typology Code available for payment source | Source: Ambulatory Visit | Attending: Radiation Oncology | Admitting: Radiation Oncology

## 2014-10-29 DIAGNOSIS — C61 Malignant neoplasm of prostate: Secondary | ICD-10-CM | POA: Diagnosis not present

## 2014-10-30 ENCOUNTER — Ambulatory Visit
Admission: RE | Admit: 2014-10-30 | Discharge: 2014-10-30 | Disposition: A | Discharge status: Home / Self Care | Payer: No Typology Code available for payment source | Source: Ambulatory Visit | Attending: Radiation Oncology | Admitting: Radiation Oncology

## 2014-10-30 DIAGNOSIS — C61 Malignant neoplasm of prostate: Secondary | ICD-10-CM | POA: Diagnosis not present

## 2014-10-31 ENCOUNTER — Ambulatory Visit
Admission: RE | Admit: 2014-10-31 | Discharge: 2014-10-31 | Disposition: A | Discharge status: Home / Self Care | Payer: No Typology Code available for payment source | Source: Ambulatory Visit | Attending: Radiation Oncology | Admitting: Radiation Oncology

## 2014-10-31 ENCOUNTER — Encounter: Payer: Self-pay | Admitting: Radiation Oncology

## 2014-10-31 VITALS — BP 155/87 | HR 73 | Resp 16 | Wt 155.5 lb

## 2014-10-31 DIAGNOSIS — C61 Malignant neoplasm of prostate: Secondary | ICD-10-CM

## 2014-10-31 NOTE — Progress Notes (Signed)
Weight and vitals stable. Denies pain. Reports intermittent mild dysuria with urination. Denies hematuria. Reports nocturia x1. Denies fatigue. Reports that on Sunday he experienced bloating, one episodes of diarrhea and nausea. Reports this resolved after he took a drink of lime and baking soda.

## 2014-10-31 NOTE — Progress Notes (Signed)
   Department of Radiation Oncology  Phone:  [phone] Fax:        [phone]  Weekly Treatment Note    Name: Joshua Pittman Date: 10/31/2014 MRN: [id number] DOB: Aug 03, 1940   Current dose: 14.4 Gy  Current fraction:8   MEDICATIONS: No current outpatient prescriptions on file.   No current facility-administered medications for this encounter.     ALLERGIES: Review of patient's allergies indicates no known allergies.   LABORATORY DATA:  Lab Results  Component Value Date   WBC 6.4 04/09/2014   HGB 12.2* 04/15/2014   HCT 36.1* 04/15/2014   MCV 92.6 04/09/2014   PLT 300 04/09/2014   Lab Results  Component Value Date   NA 139 04/09/2014   K 4.6 04/09/2014   CL 101 04/09/2014   CO2 26 04/09/2014   No results found for: ALT, AST, GGT, ALKPHOS, BILITOT   NARRATIVE: Joshua Pittman was seen today for weekly treatment management. The chart was checked and the patient's films were reviewed.  Weight and vitals stable. Denies pain. Reports intermittent mild dysuria with urination. Denies hematuria. Reports nocturia x1. Denies fatigue. Reports that on Sunday he experienced bloating, one episodes of diarrhea and nausea. Reports this resolved after he took a drink of lime and baking soda. Advised not to take vitamins during treatment but can resume afterwards.    PHYSICAL EXAMINATION: weight is 155 lb 8 oz (70.534 kg). His blood pressure is 155/87 and his pulse is 73. His respiration is 16.        ASSESSMENT: The patient is doing satisfactorily with treatment.  PLAN: We will continue with the patient's radiation treatment as planned.     This document serves as a record of services personally performed by Kyung Rudd, MD. It was created on his behalf by Derek Mound, a trained medical scribe. The creation of this record is based on the scribe's personal observations and the provider's statements to them. This document has been checked and approved by the  attending provider.

## 2014-11-03 ENCOUNTER — Ambulatory Visit
Admission: RE | Admit: 2014-11-03 | Discharge: 2014-11-03 | Disposition: A | Discharge status: Home / Self Care | Payer: No Typology Code available for payment source | Source: Ambulatory Visit | Attending: Radiation Oncology | Admitting: Radiation Oncology

## 2014-11-03 DIAGNOSIS — C61 Malignant neoplasm of prostate: Secondary | ICD-10-CM | POA: Diagnosis not present

## 2014-11-04 ENCOUNTER — Ambulatory Visit
Admission: RE | Admit: 2014-11-04 | Discharge: 2014-11-04 | Disposition: A | Discharge status: Home / Self Care | Payer: No Typology Code available for payment source | Source: Ambulatory Visit | Attending: Radiation Oncology | Admitting: Radiation Oncology

## 2014-11-04 ENCOUNTER — Encounter: Payer: Self-pay | Admitting: Medical Oncology

## 2014-11-04 DIAGNOSIS — C61 Malignant neoplasm of prostate: Secondary | ICD-10-CM | POA: Diagnosis not present

## 2014-11-04 NOTE — Progress Notes (Signed)
Mr. Martis doing well. He will begin week three of radiation tomorrow. He has been doing well only one episode of bloating and nausea last week end but that has subsided. I asked his grandson Sherrell Puller and the patient to call me with any questions or concerns. They voiced understanding.    Cira Rue, RN, BSN, [place]  [phone]  Fax [phone]

## 2014-11-05 ENCOUNTER — Ambulatory Visit
Admission: RE | Admit: 2014-11-05 | Discharge: 2014-11-05 | Disposition: A | Discharge status: Home / Self Care | Payer: No Typology Code available for payment source | Source: Ambulatory Visit | Attending: Radiation Oncology | Admitting: Radiation Oncology

## 2014-11-05 DIAGNOSIS — C61 Malignant neoplasm of prostate: Secondary | ICD-10-CM | POA: Diagnosis not present

## 2014-11-06 ENCOUNTER — Ambulatory Visit
Admission: RE | Admit: 2014-11-06 | Discharge: 2014-11-06 | Disposition: A | Discharge status: Home / Self Care | Payer: No Typology Code available for payment source | Source: Ambulatory Visit | Attending: Radiation Oncology | Admitting: Radiation Oncology

## 2014-11-06 ENCOUNTER — Encounter: Payer: Self-pay | Admitting: Radiation Oncology

## 2014-11-06 VITALS — BP 148/82 | HR 76 | Resp 16 | Wt 153.1 lb

## 2014-11-06 DIAGNOSIS — C61 Malignant neoplasm of prostate: Secondary | ICD-10-CM | POA: Diagnosis not present

## 2014-11-06 NOTE — Progress Notes (Signed)
  Radiation Oncology         [phone] ________________________________  Name: Joshua Pittman MRN: [id number]  Date: 11/06/2014  DOB: [birth year]    Weekly Radiation Therapy Management    ICD-9-CM ICD-10-CM   1. Malignant neoplasm of prostate 185 C61     Current Dose: 21.6 Gy     Planned Dose:  75 Gy  Narrative . . . . . . . . The patient presents for routine under treatment assessment.                                   Accompanied by grandson today. Weight and vitals stable. Reports mild neck discomfort. Reports a spell of dizziness this morning. States, "I feel a warm flush come over me when I enter a room." Reports occasional dysuria. Denies hematuria. Denies blood in stool. Reports Monday and Tuesday he had approximately 4-5 episodes of diarrhea but, none yesterday. Reports his grandson picked him up some OTC Imodium AD. Reports nocturia x4. Reports he continues to feel bloated. Sensation of gust going through his ears, but not all the time. He feels fatigued after those sensations. Sometimes he feels full, and a pinch of pain in his prostate. He has been eating and drinking well.                                  Set-up films were reviewed.                                 The chart was checked. Physical Findings. . .  weight is 153 lb 1.6 oz (69.446 kg). His blood pressure is 148/82 and his pulse is 76. His respiration is 16. . Weight essentially stable.  No significant changes. Impression . . . . . . . The patient is tolerating radiation. Plan . . . . . . . . . . . . Continue treatment as planned.  This document serves as a record of services personally performed by Tyler Pita, MD. It was created on his behalf by Arlyce Harman, a trained medical scribe. The creation of this record is based on the scribe's personal observations and the provider's statements to them. This document has been checked and approved by the attending provider.      ________________________________  Sheral Apley. Tammi Klippel, M.D.

## 2014-11-06 NOTE — Progress Notes (Signed)
Accompanied by grandson today. Weight and vitals stable. Reports mild neck discomfort. Reports a spell of dizziness this morning. States, "I feel a warm flush come over me when I enter a room." Reports occasional dysuria. Denies hematuria. Denies blood in stool. Reports Monday and Tuesday he had approximately 4-5 episodes of diarrhea but, none yesterday. Reports his grandson picked him up some OTC Imodium AD. Reports nocturia x4. Reports he continues to feel bloated.

## 2014-11-07 ENCOUNTER — Ambulatory Visit
Admission: RE | Admit: 2014-11-07 | Discharge: 2014-11-07 | Disposition: A | Discharge status: Home / Self Care | Payer: No Typology Code available for payment source | Source: Ambulatory Visit | Attending: Radiation Oncology | Admitting: Radiation Oncology

## 2014-11-07 DIAGNOSIS — C61 Malignant neoplasm of prostate: Secondary | ICD-10-CM | POA: Diagnosis not present

## 2014-11-10 ENCOUNTER — Ambulatory Visit
Admission: RE | Admit: 2014-11-10 | Discharge: 2014-11-10 | Disposition: A | Discharge status: Home / Self Care | Payer: No Typology Code available for payment source | Source: Ambulatory Visit | Attending: Radiation Oncology | Admitting: Radiation Oncology

## 2014-11-10 DIAGNOSIS — C61 Malignant neoplasm of prostate: Secondary | ICD-10-CM | POA: Diagnosis not present

## 2014-11-11 ENCOUNTER — Ambulatory Visit
Admission: RE | Admit: 2014-11-11 | Discharge: 2014-11-11 | Disposition: A | Discharge status: Home / Self Care | Payer: No Typology Code available for payment source | Source: Ambulatory Visit | Attending: Radiation Oncology | Admitting: Radiation Oncology

## 2014-11-11 DIAGNOSIS — C61 Malignant neoplasm of prostate: Secondary | ICD-10-CM | POA: Diagnosis not present

## 2014-11-12 ENCOUNTER — Ambulatory Visit: Payer: No Typology Code available for payment source

## 2014-11-12 ENCOUNTER — Ambulatory Visit
Admission: RE | Admit: 2014-11-12 | Discharge: 2014-11-12 | Disposition: A | Discharge status: Home / Self Care | Payer: No Typology Code available for payment source | Source: Ambulatory Visit | Attending: Radiation Oncology | Admitting: Radiation Oncology

## 2014-11-13 ENCOUNTER — Ambulatory Visit: Payer: No Typology Code available for payment source

## 2014-11-13 ENCOUNTER — Ambulatory Visit: Payer: No Typology Code available for payment source | Admitting: Radiation Oncology

## 2014-11-13 ENCOUNTER — Ambulatory Visit
Admission: RE | Admit: 2014-11-13 | Discharge: 2014-11-13 | Disposition: A | Discharge status: Home / Self Care | Payer: No Typology Code available for payment source | Source: Ambulatory Visit | Attending: Radiation Oncology | Admitting: Radiation Oncology

## 2014-11-14 ENCOUNTER — Ambulatory Visit: Payer: No Typology Code available for payment source

## 2014-11-18 ENCOUNTER — Ambulatory Visit
Admission: RE | Admit: 2014-11-18 | Discharge: 2014-11-18 | Disposition: A | Discharge status: Home / Self Care | Payer: No Typology Code available for payment source | Source: Ambulatory Visit | Attending: Radiation Oncology | Admitting: Radiation Oncology

## 2014-11-18 DIAGNOSIS — C61 Malignant neoplasm of prostate: Secondary | ICD-10-CM | POA: Diagnosis not present

## 2014-11-19 ENCOUNTER — Ambulatory Visit
Admission: RE | Admit: 2014-11-19 | Discharge: 2014-11-19 | Disposition: A | Discharge status: Home / Self Care | Payer: No Typology Code available for payment source | Source: Ambulatory Visit | Attending: Radiation Oncology | Admitting: Radiation Oncology

## 2014-11-19 ENCOUNTER — Encounter: Payer: Self-pay | Admitting: Medical Oncology

## 2014-11-19 DIAGNOSIS — C61 Malignant neoplasm of prostate: Secondary | ICD-10-CM | POA: Diagnosis not present

## 2014-11-19 NOTE — Progress Notes (Signed)
Oncology Nurse Navigator Documentation  Oncology Nurse Navigator Flowsheets 11/04/2014 11/19/2014  Navigator Encounter Type - Treatment  Patient Visit Type Radonc Radonc  Treatment Phase - Treatment- I met pt today after his radiation treatment. He is in good spirits. He missed a few days last week. He went to [place] for his grand daughters's graduation. He has no complaints and states he is doing well.  Barriers/Navigation Needs No barriers at this time No barriers at this time  Time Spent with Patient 15 15

## 2014-11-20 ENCOUNTER — Ambulatory Visit
Admission: RE | Admit: 2014-11-20 | Discharge: 2014-11-20 | Disposition: A | Discharge status: Home / Self Care | Payer: No Typology Code available for payment source | Source: Ambulatory Visit | Attending: Radiation Oncology | Admitting: Radiation Oncology

## 2014-11-20 DIAGNOSIS — C61 Malignant neoplasm of prostate: Secondary | ICD-10-CM | POA: Diagnosis not present

## 2014-11-21 ENCOUNTER — Telehealth: Payer: Self-pay | Admitting: Radiation Oncology

## 2014-11-21 ENCOUNTER — Ambulatory Visit
Admission: RE | Admit: 2014-11-21 | Discharge: 2014-11-21 | Disposition: A | Discharge status: Home / Self Care | Payer: No Typology Code available for payment source | Source: Ambulatory Visit | Attending: Radiation Oncology | Admitting: Radiation Oncology

## 2014-11-21 ENCOUNTER — Encounter: Payer: Self-pay | Admitting: Radiation Oncology

## 2014-11-21 VITALS — BP 144/67 | HR 63 | Temp 98.1°F | Resp 16 | Wt 154.8 lb

## 2014-11-21 DIAGNOSIS — R300 Dysuria: Secondary | ICD-10-CM

## 2014-11-21 DIAGNOSIS — K219 Gastro-esophageal reflux disease without esophagitis: Secondary | ICD-10-CM | POA: Diagnosis not present

## 2014-11-21 DIAGNOSIS — M25551 Pain in right hip: Secondary | ICD-10-CM | POA: Insufficient documentation

## 2014-11-21 DIAGNOSIS — C61 Malignant neoplasm of prostate: Secondary | ICD-10-CM

## 2014-11-21 DIAGNOSIS — R110 Nausea: Secondary | ICD-10-CM | POA: Insufficient documentation

## 2014-11-21 LAB — URINALYSIS, MICROSCOPIC - CHCC
BLOOD: NEGATIVE
Bilirubin (Urine): NEGATIVE
Glucose: NEGATIVE mg/dL
KETONES: NEGATIVE mg/dL
LEUKOCYTE ESTERASE: NEGATIVE
NITRITE: NEGATIVE
Protein: NEGATIVE mg/dL
RBC / HPF: NEGATIVE (ref 0–2)
SPECIFIC GRAVITY, URINE: 1.01 (ref 1.003–1.035)
Urobilinogen, UR: 0.2 mg/dL (ref 0.2–1)
pH: 7.5 (ref 4.6–8.0)

## 2014-11-21 MED ORDER — PHENAZOPYRIDINE HCL 200 MG PO TABS: 200.0000 mg | ORAL_TABLET | Freq: Three times a day (TID) | ORAL | Status: DC | PRN

## 2014-11-21 NOTE — Progress Notes (Signed)
  Radiation Oncology         [phone] ________________________________  Name: Joshua Pittman MRN: [id number]  Date: 11/21/2014  DOB: 03-19-1941    Weekly Radiation Therapy Management    ICD-9-CM ICD-10-CM   1. Malignant neoplasm of prostate 185 C61 phenazopyridine (PYRIDIUM) 200 MG tablet     Urinalysis, Microscopic - [facility]    Current Dose: 34.2 Gy     Planned Dose:  75 Gy  Narrative . . . . . . . . The patient presents for routine under treatment assessment. Weight and vitals stable. Reports new onset of dysuria, nausea, increased gastric reflux and right hip pain. Describes right hip pain as severe and burning and radiates down his leg. Reports these symptoms have been present for some time but, became severe on Tuesday. Reports nocturia x 3-4. Denies pain during urination. Pt states he drinks a lot of water. Denies burning sensation while urination. The pt is taking nausea medication. The pt's grandson states that he's giving him motion sickness medication because he gets nauseous while in the car; states it's possibly from the smell of gasoline. The pt also states he has a bitter taste while he has the severe and burning episodes of hip pain that come and go frequently. The pt also states he gets shots in his right hip. States that he has no blood in his urine and stool and urine. States he does not have diarrhea anymore. Reports a good appetite.                                 Set-up films were reviewed.                                 The chart was checked. Physical Findings. . .  weight is 154 lb 12.8 oz (70.217 kg). His oral temperature is 98.1 F (36.7 C). His blood pressure is 144/67 and his pulse is 63. His respiration is 16 and oxygen saturation is 100%. . Weight essentially stable.  No significant changes. Impression . . . . . . . The patient is tolerating radiation. Plan . . . . . . . . . . . . Continue treatment as planned. Ordered a urinalysis to check the pt for an  infection. I have prescribed Pyridium for the dysuria.  This document serves as a record of services personally performed by Tyler Pita, MD. It was created on his behalf by Darcus Austin, a trained medical scribe. The creation of this record is based on the scribe's personal observations and the provider's statements to them. This document has been checked and approved by the attending provider.    ________________________________  Sheral Apley. Tammi Klippel, M.D.

## 2014-11-21 NOTE — Telephone Encounter (Signed)
Phoned patient's grandson, Sherrell Puller (who speaks [place]). Informed him that his grandfather's UA is normal. Explained culture results should be in by Monday. This RN will call with culture results on Monday. Ramsey verbalized understanding.

## 2014-11-21 NOTE — Progress Notes (Signed)
Weight and vitals stable. Reports new onset of dysuria, nausea, increased gastric reflux and right hip pain. Describes right hip pain as severe and burning. Reports these symptoms have been present for some time but, became severe on Tuesday. Reports nocturia x 5.   BP 144/67 mmHg  Pulse 63  Temp(Src) 98.1 F (36.7 C) (Oral)  Resp 16  Wt 154 lb 12.8 oz (70.217 kg)  SpO2 100% Wt Readings from Last 3 Encounters:  04/14/14 153 lb (69.4 kg)

## 2014-11-21 NOTE — Addendum Note (Signed)
Encounter addended by: Heywood Footman, RN on: 11/21/2014  9:22 AM<BR>     Documentation filed: Visit Diagnoses, Dx Association, Orders

## 2014-11-24 ENCOUNTER — Ambulatory Visit
Admission: RE | Admit: 2014-11-24 | Discharge: 2014-11-24 | Disposition: A | Discharge status: Home / Self Care | Payer: No Typology Code available for payment source | Source: Ambulatory Visit | Attending: Radiation Oncology | Admitting: Radiation Oncology

## 2014-11-24 ENCOUNTER — Ambulatory Visit: Admission: RE | Admit: 2014-11-24 | Payer: No Typology Code available for payment source | Source: Ambulatory Visit

## 2014-11-25 ENCOUNTER — Ambulatory Visit
Admission: RE | Admit: 2014-11-25 | Discharge: 2014-11-25 | Disposition: A | Discharge status: Home / Self Care | Payer: No Typology Code available for payment source | Source: Ambulatory Visit | Attending: Radiation Oncology | Admitting: Radiation Oncology

## 2014-11-25 DIAGNOSIS — C61 Malignant neoplasm of prostate: Secondary | ICD-10-CM | POA: Diagnosis not present

## 2014-11-26 ENCOUNTER — Ambulatory Visit
Admission: RE | Admit: 2014-11-26 | Discharge: 2014-11-26 | Disposition: A | Discharge status: Home / Self Care | Payer: No Typology Code available for payment source | Source: Ambulatory Visit | Attending: Radiation Oncology | Admitting: Radiation Oncology

## 2014-11-26 DIAGNOSIS — C61 Malignant neoplasm of prostate: Secondary | ICD-10-CM | POA: Diagnosis not present

## 2014-11-27 ENCOUNTER — Encounter: Payer: Self-pay | Admitting: Radiation Oncology

## 2014-11-27 ENCOUNTER — Ambulatory Visit: Payer: No Typology Code available for payment source

## 2014-11-27 ENCOUNTER — Ambulatory Visit
Admission: RE | Admit: 2014-11-27 | Discharge: 2014-11-27 | Disposition: A | Discharge status: Home / Self Care | Payer: No Typology Code available for payment source | Source: Ambulatory Visit | Attending: Radiation Oncology | Admitting: Radiation Oncology

## 2014-11-27 VITALS — BP 137/75 | HR 61 | Temp 97.5°F | Resp 20 | Wt 150.6 lb

## 2014-11-27 DIAGNOSIS — C61 Malignant neoplasm of prostate: Secondary | ICD-10-CM

## 2014-11-27 NOTE — Progress Notes (Signed)
  Radiation Oncology         [phone] ________________________________  Name: Joshua Pittman MRN: [id number]  Date: 11/27/2014  DOB: Jan 12, 1941    Weekly Radiation Therapy Management    ICD-9-CM ICD-10-CM   1. Malignant neoplasm of prostate 185 C61     Current Dose: 39.6 Gy     Planned Dose:  75 Gy  Narrative . . . . . . . . The patient presents for routine under treatment assessment. Weekly radiation prostate, 22 completed, taking pyridium has helped stated, no dysuria nor, still has nocturia x4, no hesitancy, no urgency, no hematuria, regular bowel movements o diarrhea, good appetite, no fatigue, no pain, vitals stable, interpreter Joshua Pittman. In with patient.                                 Set-up films were reviewed.  The chart was checked.  Physical Findings. . . .BP 137/75 mmHg  Pulse 61  Temp(Src) 97.5 F (36.4 C) (Oral)  Resp 20  Wt 150 lb 9.6 oz (68.312 kg)  Weight essentially stable.  No significant changes.  BP 137/75 mmHg  Pulse 61  Temp(Src) 97.5 F (36.4 C) (Oral)  Resp 20  Wt 150 lb 9.6 oz (68.312 kg)   Wt Readings from Last 3 Encounters:  11/27/14 150 lb 9.6 oz (68.312 kg)  11/21/14 154 lb 12.8 oz (70.217 kg)  11/06/14 153 lb 1.6 oz (69.446 kg)    Impression . . . . . . . The patient is tolerating radiation.  Plan . . . . . . . . . . . . Continue treatment as planned. Discussed the radiation boost.  This document serves as a record of services personally performed by Tyler Pita, MD. It was created on his behalf by Jeralene Peters, a trained medical scribe. The creation of this record is based on the scribe's personal observations and the provider's statements to them. This document has been checked and approved by the attending provider.     ________________________________  Sheral Apley. Tammi Klippel, M.D.

## 2014-11-27 NOTE — Progress Notes (Addendum)
Weekly radiation prostate, 22 completed, taking pyridium has helped stated, no dysuria now, still has nocturia x4, no hesitancy, no urgency, no hematuria, regular bowel movements o diarrhea, good appetite, no fatigue, no pain, vitals stable, interpreter Clovis Riley  In with patient BP 137/75 mmHg  Pulse 61  Temp(Src) 97.5 F (36.4 C) (Oral)  Resp 20  Wt 150 lb 9.6 oz (68.312 kg)  Wt Readings from Last 3 Encounters:  11/27/14 150 lb 9.6 oz (68.312 kg)  11/21/14 154 lb 12.8 oz (70.217 kg)  11/06/14 153 lb 1.6 oz (69.446 kg)   8:34 AM

## 2014-11-28 ENCOUNTER — Ambulatory Visit: Admission: RE | Admit: 2014-11-28 | Payer: No Typology Code available for payment source | Source: Ambulatory Visit

## 2014-11-28 ENCOUNTER — Ambulatory Visit: Payer: No Typology Code available for payment source

## 2014-11-28 DIAGNOSIS — C61 Malignant neoplasm of prostate: Secondary | ICD-10-CM | POA: Diagnosis not present

## 2014-11-29 DIAGNOSIS — C61 Malignant neoplasm of prostate: Secondary | ICD-10-CM | POA: Diagnosis not present

## 2014-12-01 ENCOUNTER — Ambulatory Visit
Admission: RE | Admit: 2014-12-01 | Discharge: 2014-12-01 | Disposition: A | Discharge status: Home / Self Care | Payer: No Typology Code available for payment source | Source: Ambulatory Visit | Attending: Radiation Oncology | Admitting: Radiation Oncology

## 2014-12-01 DIAGNOSIS — C61 Malignant neoplasm of prostate: Secondary | ICD-10-CM | POA: Diagnosis not present

## 2014-12-02 ENCOUNTER — Ambulatory Visit
Admission: RE | Admit: 2014-12-02 | Discharge: 2014-12-02 | Disposition: A | Discharge status: Home / Self Care | Payer: No Typology Code available for payment source | Source: Ambulatory Visit | Attending: Radiation Oncology | Admitting: Radiation Oncology

## 2014-12-02 DIAGNOSIS — C61 Malignant neoplasm of prostate: Secondary | ICD-10-CM | POA: Diagnosis not present

## 2014-12-03 ENCOUNTER — Ambulatory Visit: Payer: No Typology Code available for payment source

## 2014-12-03 ENCOUNTER — Ambulatory Visit
Admission: RE | Admit: 2014-12-03 | Discharge: 2014-12-03 | Disposition: A | Discharge status: Home / Self Care | Payer: No Typology Code available for payment source | Source: Ambulatory Visit | Attending: Radiation Oncology | Admitting: Radiation Oncology

## 2014-12-03 DIAGNOSIS — C61 Malignant neoplasm of prostate: Secondary | ICD-10-CM | POA: Diagnosis not present

## 2014-12-04 ENCOUNTER — Ambulatory Visit
Admission: RE | Admit: 2014-12-04 | Discharge: 2014-12-04 | Disposition: A | Discharge status: Home / Self Care | Payer: No Typology Code available for payment source | Source: Ambulatory Visit | Attending: Radiation Oncology | Admitting: Radiation Oncology

## 2014-12-04 DIAGNOSIS — C61 Malignant neoplasm of prostate: Secondary | ICD-10-CM | POA: Diagnosis not present

## 2014-12-05 ENCOUNTER — Ambulatory Visit
Admission: RE | Admit: 2014-12-05 | Discharge: 2014-12-05 | Disposition: A | Discharge status: Home / Self Care | Payer: No Typology Code available for payment source | Source: Ambulatory Visit | Attending: Radiation Oncology | Admitting: Radiation Oncology

## 2014-12-05 ENCOUNTER — Encounter: Payer: Self-pay | Admitting: Radiation Oncology

## 2014-12-05 VITALS — BP 139/68 | HR 69 | Resp 16 | Wt 149.6 lb

## 2014-12-05 DIAGNOSIS — C61 Malignant neoplasm of prostate: Secondary | ICD-10-CM | POA: Diagnosis not present

## 2014-12-05 NOTE — Progress Notes (Signed)
Weight and vitals stable. Denies pain. Stopped pyridium when dysuria resolved. Reports loose stools. Reports burning at the end of each bowel movement. Denies blood in stool.  Reports nocturia x 2. Reports right hip pain has resolved.   BP 139/68 mmHg  Pulse 69  Resp 16  Wt 149 lb 9.6 oz (67.858 kg) Wt Readings from Last 3 Encounters:  12/05/14 149 lb 9.6 oz (67.858 kg)  11/27/14 150 lb 9.6 oz (68.312 kg)  11/21/14 154 lb 12.8 oz (70.217 kg)

## 2014-12-05 NOTE — Progress Notes (Signed)
  Radiation Oncology         [phone] ________________________________  Name: Joshua Pittman MRN: [id number]  Date: 12/05/2014  DOB: [birth year]    Weekly Radiation Therapy Management    ICD-9-CM ICD-10-CM   1. Malignant neoplasm of prostate 185 C61     Current Dose: 50.4 Gy     Planned Dose:  68.4 Gy  Narrative . . . . . . . . The patient presents for routine under treatment assessment. Vitals stable. The pt is accompanied by his son and an interpreter. Denies pain. Stopped pyridium when dysuria resolved. Reports loose stools. Reports burning at the end of each bowel movement. Denies blood in stool. Reports nocturia x 2. Pt states he is able to urinate with no problems. Reports right hip pain has resolved. He doesn't take the imodium when he doesn't have a lot of loose stools, but takes it when he does. The pt may go to [place], [place] on Monday to get a passport to travel back to [place].                                  Set-up films were reviewed.                                 The chart was checked. Physical Findings. . .  weight is 149 lb 9.6 oz (67.858 kg). His blood pressure is 139/68 and his pulse is 69. His respiration is 16.  No significant changes. Wt Readings from Last 3 Encounters:   12/05/14  149 lb 9.6 oz (67.858 kg)   11/27/14  150 lb 9.6 oz (68.312 kg)   11/21/14  154 lb 12.8 oz (70.217 kg)      Impression . . . . . . . The patient is tolerating radiation. Plan . . . . . . . . . . . . Continue treatment as planned. I advised the pt to drink more water before coming in for treatment.  This document serves as a record of services personally performed by Tyler Pita, MD. It was created on his behalf by Darcus Austin, a trained medical scribe. The creation of this record is based on the scribe's personal observations and the provider's statements to them. This document has been checked and approved by the attending provider.     ________________________________  Sheral Apley. Tammi Klippel, M.D.

## 2014-12-08 ENCOUNTER — Ambulatory Visit
Admission: RE | Admit: 2014-12-08 | Discharge: 2014-12-08 | Disposition: A | Discharge status: Home / Self Care | Payer: No Typology Code available for payment source | Source: Ambulatory Visit | Attending: Radiation Oncology | Admitting: Radiation Oncology

## 2014-12-08 ENCOUNTER — Ambulatory Visit: Payer: No Typology Code available for payment source

## 2014-12-09 ENCOUNTER — Ambulatory Visit
Admission: RE | Admit: 2014-12-09 | Discharge: 2014-12-09 | Disposition: A | Discharge status: Home / Self Care | Payer: No Typology Code available for payment source | Source: Ambulatory Visit | Attending: Radiation Oncology | Admitting: Radiation Oncology

## 2014-12-09 DIAGNOSIS — C61 Malignant neoplasm of prostate: Secondary | ICD-10-CM | POA: Diagnosis not present

## 2014-12-10 ENCOUNTER — Ambulatory Visit
Admission: RE | Admit: 2014-12-10 | Discharge: 2014-12-10 | Disposition: A | Discharge status: Home / Self Care | Payer: No Typology Code available for payment source | Source: Ambulatory Visit | Attending: Radiation Oncology | Admitting: Radiation Oncology

## 2014-12-10 ENCOUNTER — Ambulatory Visit: Payer: No Typology Code available for payment source

## 2014-12-11 ENCOUNTER — Ambulatory Visit
Admission: RE | Admit: 2014-12-11 | Discharge: 2014-12-11 | Disposition: A | Discharge status: Home / Self Care | Payer: No Typology Code available for payment source | Source: Ambulatory Visit | Attending: Radiation Oncology | Admitting: Radiation Oncology

## 2014-12-11 DIAGNOSIS — C61 Malignant neoplasm of prostate: Secondary | ICD-10-CM | POA: Diagnosis not present

## 2014-12-12 ENCOUNTER — Ambulatory Visit
Admission: RE | Admit: 2014-12-12 | Discharge: 2014-12-12 | Disposition: A | Discharge status: Home / Self Care | Payer: No Typology Code available for payment source | Source: Ambulatory Visit | Attending: Radiation Oncology | Admitting: Radiation Oncology

## 2014-12-12 ENCOUNTER — Encounter: Payer: Self-pay | Admitting: Radiation Oncology

## 2014-12-12 VITALS — BP 139/66 | HR 66 | Resp 16 | Wt 151.0 lb

## 2014-12-12 DIAGNOSIS — C61 Malignant neoplasm of prostate: Secondary | ICD-10-CM

## 2014-12-12 NOTE — Progress Notes (Signed)
Weight and vitals stable. Denies pain. Reports nocturia x 4. Denies hematuria or dysuria. Denies diarrhea. Reports dizziness and fatigue for thirty minutes following treatment each day that resolves on its own. Concerned about "water filled bumps" on his low abdomen/upper perineal area. Assessed area but, no fluid filled bumps found. Patient questions when he can resume taking his Vitamin D again.  BP 139/66 mmHg  Pulse 66  Resp 16  Wt 151 lb (68.493 kg) Wt Readings from Last 3 Encounters:  12/12/14 151 lb (68.493 kg)  12/05/14 149 lb 9.6 oz (67.858 kg)  11/27/14 150 lb 9.6 oz (68.312 kg)

## 2014-12-12 NOTE — Progress Notes (Signed)
  Radiation Oncology         [phone]   Name: Joshua Pittman MRN: [id number]   Date: 12/12/2014  DOB: 1940/07/11   Weekly Radiation Therapy Management    ICD-9-CM ICD-10-CM   1. Malignant neoplasm of prostate 185 C61     Current Dose: 55.8 Gy  Planned Dose:  68.4 Gy  Narrative The patient presents for routine under treatment assessment. Weight and vitals stable. Denies pain. Reports nocturia x 4. Denies hematuria or dysuria. Denies diarrhea. Reports dizziness and fatigue for thirty minutes following treatment each day that resolves on its own. Concerned about a burning sensation on his lower abdomen/upper perineal area. Patient questions when he can resume taking his Vitamin D again. The pt also states he has sharp right hip pain. He feels like his hip would dislocate when he rolls in bed and has difficulty walking on that hip when he gets out of bed, but starts to walk better as the morning goes on. Set-up films were reviewed. The chart was checked.  Physical Findings  weight is 151 lb (68.493 kg). His blood pressure is 139/66 and his pulse is 66. His respiration is 16.  Weight essentially stable.  No significant changes. Minor discoloration on the suprapubic skin.   Impression The patient is tolerating radiation.  Plan Continue treatment as planned. I gave the patient nystatin powder to apply to the treatment area twice a day for his suprapubic discomfort.   This document serves as a record of services personally performed by Tyler Pita, MD. It was created on his behalf by Darcus Austin, a trained medical scribe. The creation of this record is based on the scribe's personal observations and the provider's statements to them. This document has been checked and approved by the attending provider.      Sheral Apley Tammi Klippel, M.D.

## 2014-12-15 ENCOUNTER — Ambulatory Visit
Admission: RE | Admit: 2014-12-15 | Discharge: 2014-12-15 | Disposition: A | Discharge status: Home / Self Care | Payer: No Typology Code available for payment source | Source: Ambulatory Visit | Attending: Radiation Oncology | Admitting: Radiation Oncology

## 2014-12-15 ENCOUNTER — Ambulatory Visit: Payer: No Typology Code available for payment source

## 2014-12-15 ENCOUNTER — Encounter: Payer: Self-pay | Admitting: Medical Oncology

## 2014-12-15 DIAGNOSIS — C61 Malignant neoplasm of prostate: Secondary | ICD-10-CM | POA: Diagnosis not present

## 2014-12-15 NOTE — Progress Notes (Signed)
Oncology Nurse Navigator Documentation  Oncology Nurse Navigator Flowsheets 11/04/2014 11/19/2014 12/15/2014  Navigator Encounter Type - Treatment Treatment  Patient Visit Type Radonc Radonc Radonc  Treatment Phase - Treatment Treatment- I saw pt this morning in radiation. He is doing well. He has had some issues with hip pain but better today. He has 2 more weeks of treatment and looking forward to be finished.   Barriers/Navigation Needs No barriers at this time No barriers at this time No barriers at this time  Time Spent with Patient 15 15 15

## 2014-12-16 ENCOUNTER — Ambulatory Visit
Admission: RE | Admit: 2014-12-16 | Discharge: 2014-12-16 | Disposition: A | Discharge status: Home / Self Care | Payer: No Typology Code available for payment source | Source: Ambulatory Visit | Attending: Radiation Oncology | Admitting: Radiation Oncology

## 2014-12-16 DIAGNOSIS — C61 Malignant neoplasm of prostate: Secondary | ICD-10-CM | POA: Diagnosis not present

## 2014-12-17 ENCOUNTER — Ambulatory Visit: Payer: No Typology Code available for payment source

## 2014-12-17 ENCOUNTER — Ambulatory Visit
Admission: RE | Admit: 2014-12-17 | Discharge: 2014-12-17 | Disposition: A | Discharge status: Home / Self Care | Payer: No Typology Code available for payment source | Source: Ambulatory Visit | Attending: Radiation Oncology | Admitting: Radiation Oncology

## 2014-12-17 DIAGNOSIS — C61 Malignant neoplasm of prostate: Secondary | ICD-10-CM | POA: Diagnosis not present

## 2014-12-18 ENCOUNTER — Ambulatory Visit
Admission: RE | Admit: 2014-12-18 | Discharge: 2014-12-18 | Disposition: A | Discharge status: Home / Self Care | Payer: No Typology Code available for payment source | Source: Ambulatory Visit | Attending: Radiation Oncology | Admitting: Radiation Oncology

## 2014-12-18 ENCOUNTER — Encounter: Payer: Self-pay | Admitting: Radiation Oncology

## 2014-12-18 VITALS — BP 142/77 | HR 54 | Resp 16 | Wt 151.1 lb

## 2014-12-18 DIAGNOSIS — C61 Malignant neoplasm of prostate: Secondary | ICD-10-CM

## 2014-12-18 NOTE — Progress Notes (Signed)
  Radiation Oncology         [phone]   Name: Joshua Pittman MRN: [id number]   Date: 12/18/2014  DOB: March 07, 1941   Weekly Radiation Therapy Management  Diagnosis: Sabastian Raimondi is a 74 year old gentleman presenting to clinic in regards to his alignant neoplasm of the prostate.  Current Dose: 63 Gy  Planned Dose:  68.4 Gy  Narrative The patient presents for routine under treatment assessment. The patient's weight and vitals were stable. He reports intermittent right knee pain related to an old injury sustained while chasing a pig in [place]. He also reports diarrhea is manageable with Imodium. The patient also reports dysuria only with initial void, nocturia x2 and denies blood in stool or urine. Set-up films were reviewed. The chart was checked. A translator was provided for the patient.  Physical Findings  weight is 151 lb 1.6 oz (68.539 kg). His blood pressure is 142/77 and his pulse is 54. His respiration is 16.  The patient's weight is essentially stable with no other significant changes in health. Minor discoloration was observed on the suprapubic skin.   Impression Merick Kelleher is a 75 year old gentleman presenting to clinic in regards to his alignant neoplasm of the prostate. The patient is tolerating radiation.  Plan We advised the patient to continue treatment as planned. The patient was informed to continue use of nystatin powder, previously provided as needed (apply to the treatment area twice a day for his suprapubic discomfort). The patient was provided with a translator the entire visit and informed of his appointment with radiation oncology in one month.    This document serves as a record of services personally performed by Tyler Pita, MD. It was created on his behalf by Lenn Cal, a trained medical scribe. The creation of this record is based on the scribe's personal observations and the provider's statements to them. This document has been checked  and approved by the attending provider.   Sheral Apley Tammi Klippel, M.D.

## 2014-12-18 NOTE — Progress Notes (Signed)
Weight and vitals stable. Reports intermittent right knee pain related to an old injury sustained while chasing a pig in [place]. Reports diarrhea is manageable with Imodium. Reports dysuria only with initial void. Reports nocturia x2. Denies blood in stool or urine. Reports manageable fatigue.  BP 142/77 mmHg  Pulse 54  Resp 16  Wt 151 lb 1.6 oz (68.539 kg) Wt Readings from Last 3 Encounters:  12/18/14 151 lb 1.6 oz (68.539 kg)  12/12/14 151 lb (68.493 kg)  12/05/14 149 lb 9.6 oz (67.858 kg)

## 2014-12-19 ENCOUNTER — Ambulatory Visit
Admission: RE | Admit: 2014-12-19 | Discharge: 2014-12-19 | Disposition: A | Discharge status: Home / Self Care | Payer: No Typology Code available for payment source | Source: Ambulatory Visit | Attending: Radiation Oncology | Admitting: Radiation Oncology

## 2014-12-19 DIAGNOSIS — C61 Malignant neoplasm of prostate: Secondary | ICD-10-CM | POA: Diagnosis not present

## 2014-12-23 ENCOUNTER — Ambulatory Visit
Admission: RE | Admit: 2014-12-23 | Discharge: 2014-12-23 | Disposition: A | Discharge status: Home / Self Care | Payer: No Typology Code available for payment source | Source: Ambulatory Visit | Attending: Radiation Oncology | Admitting: Radiation Oncology

## 2014-12-23 ENCOUNTER — Ambulatory Visit: Payer: No Typology Code available for payment source

## 2014-12-23 DIAGNOSIS — C61 Malignant neoplasm of prostate: Secondary | ICD-10-CM | POA: Diagnosis not present

## 2014-12-24 ENCOUNTER — Encounter: Payer: Self-pay | Admitting: Medical Oncology

## 2014-12-24 ENCOUNTER — Ambulatory Visit
Admission: RE | Admit: 2014-12-24 | Discharge: 2014-12-24 | Disposition: A | Discharge status: Home / Self Care | Payer: No Typology Code available for payment source | Source: Ambulatory Visit | Attending: Radiation Oncology | Admitting: Radiation Oncology

## 2014-12-24 ENCOUNTER — Ambulatory Visit: Payer: No Typology Code available for payment source

## 2014-12-24 ENCOUNTER — Encounter: Payer: Self-pay | Admitting: Radiation Oncology

## 2014-12-24 DIAGNOSIS — C61 Malignant neoplasm of prostate: Secondary | ICD-10-CM | POA: Diagnosis not present

## 2014-12-24 NOTE — Progress Notes (Signed)
Oncology Nurse Navigator Documentation  Oncology Nurse Navigator Flowsheets 11/19/2014 12/15/2014 12/24/2014  Navigator Encounter Type Treatment Treatment -Celebrated with  Patient and his family by ringing of the bell. He is doing well and will return to see Dr. Tammi Klippel in one month. He has a follow up appointment with Dr. Alinda Money in August. I asked patient and his family to call with any questions or concerns. They voiced understanding.  Patient Visit Type Radonc - -  Treatment Phase Treatment Treatment Final Radiation Tx  Barriers/Navigation Needs No barriers at this time - No barriers at this time  Support Groups/Services - - Friends and Family  Time Spent with Patient [phone]

## 2014-12-24 NOTE — Progress Notes (Addendum)
Patient accompanied by wife and grandson today. Patient has completed radiation treatment. Patient without complaints. Patient denies pain. Patient scheduled to follow up with urology in August. Patient understands to contact this RN with future needs. Patient will follow up with Dr. Tammi Klippel in one month. One month follow up appointment card given. Patient, wife and grandson expressed gratitude for the care received.

## 2014-12-25 ENCOUNTER — Ambulatory Visit: Admission: RE | Admit: 2014-12-25 | Payer: No Typology Code available for payment source | Source: Ambulatory Visit

## 2014-12-25 ENCOUNTER — Ambulatory Visit: Payer: No Typology Code available for payment source

## 2014-12-26 ENCOUNTER — Ambulatory Visit: Payer: No Typology Code available for payment source

## 2015-01-01 NOTE — Progress Notes (Signed)
°  Radiation Oncology         [phone] ________________________________  Name: Joshua Pittman MRN: [id number]  Date: 12/24/2014   DOB: Jan 15, 1941  End of Treatment Note   ICD-9-CM ICD-10-CM    1. 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 The prostatic fossa and pelvic lymph nodes were initially treated to 45 Gy in 25 fractions of 1.8 Gy  2. The prostatic fossa only was boosted to 68.4 Gy with 13 additional fractions of 1.8 Gy   Beams/energy:  1. The prostate, seminal vesicles, and pelvic lymph nodes were initially treated using helical intensity modulated radiotherapy delivering 6 megavolt photons. Image guidance was performed with megavoltage CT studies prior to each fraction. He was immobilized with a body fix lower extremity mold.  2. the prostate only was boosted using helical intensity modulated radiotherapy delivering 6 megavolt photons. Image guidance was performed with megavoltage CT studies prior to each fraction. He was immobilized with a body fix lower extremity mold.  Narrative: The patient tolerated radiation treatment relatively well.   The patient experienced some minor urinary irritation and modest fatigue.  He had a few bouts of diarrhea.  Plan: The patient has completed radiation treatment. He will return to radiation oncology clinic for routine followup in one month. I advised him to call or return sooner if he has any questions or concerns related to his recovery or treatment. ________________________________  Sheral Apley. Tammi Klippel, M.D.

## 2015-02-05 ENCOUNTER — Encounter: Payer: Self-pay | Admitting: Radiation Oncology

## 2015-02-05 ENCOUNTER — Encounter: Payer: Self-pay | Admitting: Medical Oncology

## 2015-02-05 ENCOUNTER — Ambulatory Visit
Admission: RE | Admit: 2015-02-05 | Discharge: 2015-02-05 | Disposition: A | Discharge status: Home / Self Care | Payer: No Typology Code available for payment source | Source: Ambulatory Visit | Attending: Radiation Oncology | Admitting: Radiation Oncology

## 2015-02-05 ENCOUNTER — Telehealth: Payer: Self-pay | Admitting: Radiation Oncology

## 2015-02-05 VITALS — BP 137/65 | HR 62 | Resp 16 | Wt 156.7 lb

## 2015-02-05 DIAGNOSIS — C61 Malignant neoplasm of prostate: Secondary | ICD-10-CM

## 2015-02-05 NOTE — Progress Notes (Signed)
Weight and vitals stable. Reports pain in his right hip that radiates down to his knee. Reports pain resolves with Naproxen. Reports nocturia 0-3. Denies dysuria or hematuria. Reports stress incontinence when he bends over. Denies diarrhea. Describes a strong steady urine stream. Reports fatigue has resolved.  BP 137/65 mmHg  Pulse 62  Resp 16  Wt 156 lb 11.2 oz (71.079 kg)  SpO2 100% Wt Readings from Last 3 Encounters:  02/05/15 156 lb 11.2 oz (71.079 kg)  12/24/14 152 lb (68.947 kg)  12/18/14 151 lb 1.6 oz (68.539 kg)

## 2015-02-05 NOTE — Progress Notes (Signed)
  Radiation Oncology         [phone] ________________________________  Name: Joshua Pittman MRN: [id number]  Date: 02/05/2015  DOB: December 05, 1940  Follow-Up Visit Note  CC: No primary care provider on file.  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 Prostate cancer 185 C61     Interval Since Last Radiation:  6 weeks. 10/22/2014-12/24/2014 Site/dose:  1. The prostatic fossa and pelvic lymph nodes were initially treated to 45 Gy in 25 fractions of 1.8 Gy  2. The prostatic fossa only was boosted to 68.4 Gy with 13 additional fractions of 1.8 Gy    Narrative:  The patient returns today for routine follow-up. The pt's grandson is present who is translating. The pt reports pain in his right hip that radiates down to his knee. Reports pain resolves with Naproxen. Reports nocturia 0-3. Denies dysuria or hematuria. Reports stress incontinence when he bends over. Denies diarrhea. Describes a strong steady urine stream. Reports fatigue has resolved. The pt reports missing his appointment with Dr. Alinda Money on 8/10. However, his grandson has already called his office to reschedule.                ALLERGIES:  has No Known Allergies.  Meds: No current outpatient prescriptions on file.   No current facility-administered medications for this encounter.    Physical Findings: The patient is in no acute distress. Patient is alert and oriented.  weight is 156 lb 11.2 oz (71.079 kg). His blood pressure is 137/65 and his pulse is 62. His respiration is 16 and oxygen saturation is 100%. .  No significant changes.  Lab Findings: Lab Results  Component Value Date   WBC 6.4 04/09/2014   HGB 12.2* 04/15/2014   HCT 36.1* 04/15/2014   PLT 300 04/09/2014    Lab Results  Component Value Date   NA 139 04/09/2014   K 4.6 04/09/2014   CO2 26 04/09/2014   GLUCOSE 90 04/09/2014    BUN 15 04/09/2014   CREATININE 0.88 04/09/2014   CALCIUM 9.5 04/09/2014   ANIONGAP 12 04/09/2014    Radiographic Findings: No results found.  Impression:  The patient is recovering from the effects of radiation.  Plan:  He will continue to follow-up with urology for ongoing PSA determinations and his androgen deprivation shots. I explained that he should speak with urology about pelvic floor exercises to help with his incontinence. I will look forward to following his response through their correspondence, and be happy to participate in care if clinically indicated.  I talked to the patient about what to expect in the future, including his risk for erectile dysfunction and rectal bleeding.  I encouraged him to call or return to the office if he has any question about his previous radiation or possible radiation effects.  He was comfortable with this plan.  This document serves as a record of services personally performed by Tyler Pita, MD. It was created on his behalf by Darcus Austin, a trained medical scribe. The creation of this record is based on the scribe's personal observations and the provider's statements to them. This document has been checked and approved by the attending provider.     _____________________________________  Sheral Apley. Tammi Klippel, M.D.

## 2015-02-05 NOTE — Telephone Encounter (Signed)
Patient's grandfather has not shown for 0900 follow up with Dr. Tammi Klippel. Left message requesting return call to reschedule.

## 2015-04-17 ENCOUNTER — Telehealth: Payer: Self-pay | Admitting: Medical Oncology

## 2015-04-17 NOTE — Telephone Encounter (Signed)
Oncology Nurse Navigator Documentation  Oncology Nurse Navigator Flowsheets 12/24/2014 02/05/2015 04/17/2015  Navigator Encounter Type - - Telephone;3 month-Left a message with grandson Sherrell Puller requesting a return call. Following up with Mr. Muska post radiation.  Patient Visit Type - Follow-up -  Barriers/Navigation Needs No barriers at this time No barriers at this time -  Support Groups/Services Friends and Family Friends and Family -  Time Spent with Patient - 70 -

## 2015-04-28 ENCOUNTER — Encounter: Payer: Self-pay | Admitting: Medical Oncology

## 2015-05-06 ENCOUNTER — Telehealth: Payer: Self-pay | Admitting: Medical Oncology

## 2015-05-06 NOTE — Telephone Encounter (Signed)
Oncology Nurse Navigator Documentation  Oncology Nurse Navigator Flowsheets 04/17/2015 04/27/2015 04/27/2015  Navigator Encounter Type Telephone;3 month Telephone;3 month Telephone;3 month [place] grandson returned call and states his grandfather is doing well without complaints.   Patient Visit Type - - -  Barriers/Navigation Needs - - No barriers at this time  Support Groups/Services - - Friends and Family  Time Spent with Patient - [phone]

## 2017-02-09 ENCOUNTER — Emergency Department (HOSPITAL_COMMUNITY)
Admission: EM | Admit: 2017-02-09 | Discharge: 2017-02-09 | Disposition: A | Discharge status: Home / Self Care | Payer: No Typology Code available for payment source | Attending: Emergency Medicine | Admitting: Emergency Medicine

## 2017-02-09 ENCOUNTER — Encounter (HOSPITAL_COMMUNITY): Payer: Self-pay | Admitting: Emergency Medicine

## 2017-02-09 DIAGNOSIS — Z8546 Personal history of malignant neoplasm of prostate: Secondary | ICD-10-CM | POA: Insufficient documentation

## 2017-02-09 DIAGNOSIS — F17210 Nicotine dependence, cigarettes, uncomplicated: Secondary | ICD-10-CM | POA: Insufficient documentation

## 2017-02-09 DIAGNOSIS — R300 Dysuria: Secondary | ICD-10-CM | POA: Insufficient documentation

## 2017-02-09 DIAGNOSIS — R319 Hematuria, unspecified: Secondary | ICD-10-CM | POA: Insufficient documentation

## 2017-02-09 LAB — CBC WITH DIFFERENTIAL/PLATELET
Basophils Absolute: 0 10*3/uL (ref 0.0–0.1)
Basophils Relative: 0 %
Eosinophils Absolute: 0.1 10*3/uL (ref 0.0–0.7)
Eosinophils Relative: 2 %
HCT: 38 % — ABNORMAL LOW (ref 39.0–52.0)
Hemoglobin: 12.8 g/dL — ABNORMAL LOW (ref 13.0–17.0)
LYMPHS PCT: 14 %
Lymphs Abs: 0.7 10*3/uL (ref 0.7–4.0)
MCH: 31.9 pg (ref 26.0–34.0)
MCHC: 33.7 g/dL (ref 30.0–36.0)
MCV: 94.8 fL (ref 78.0–100.0)
Monocytes Absolute: 0.5 10*3/uL (ref 0.1–1.0)
Monocytes Relative: 11 %
NEUTROS ABS: 3.5 10*3/uL (ref 1.7–7.7)
Neutrophils Relative %: 73 %
Platelets: 239 10*3/uL (ref 150–400)
RBC: 4.01 MIL/uL — ABNORMAL LOW (ref 4.22–5.81)
RDW: 13.3 % (ref 11.5–15.5)
WBC: 4.8 10*3/uL (ref 4.0–10.5)

## 2017-02-09 LAB — BASIC METABOLIC PANEL
Anion gap: 4 — ABNORMAL LOW (ref 5–15)
BUN: 15 mg/dL (ref 6–20)
CALCIUM: 9.1 mg/dL (ref 8.9–10.3)
CO2: 27 mmol/L (ref 22–32)
Chloride: 107 mmol/L (ref 101–111)
Creatinine, Ser: 0.79 mg/dL (ref 0.61–1.24)
GFR calc Af Amer: >60 mL/min (ref >60)
GFR calc non Af Amer: >60 mL/min (ref >60)
Glucose, Bld: 93 mg/dL (ref 65–99)
Potassium: 4.4 mmol/L (ref 3.5–5.1)
SODIUM: 138 mmol/L (ref 135–145)

## 2017-02-09 LAB — URINALYSIS, ROUTINE W REFLEX MICROSCOPIC
Bilirubin Urine: NEGATIVE
Glucose, UA: NEGATIVE mg/dL
Hgb urine dipstick: NEGATIVE
Ketones, ur: NEGATIVE mg/dL
LEUKOCYTES UA: NEGATIVE
Nitrite: NEGATIVE
Protein, ur: NEGATIVE mg/dL
Specific Gravity, Urine: 1.016 (ref 1.005–1.030)
pH: 5 (ref 5.0–8.0)

## 2017-02-09 NOTE — Discharge Instructions (Signed)
You were seen in the ED today with blood in the urine. We did not find any blood in the urine today. Plan to call your Urologist today and set up a follow up appointment in the next 2 weeks.

## 2017-02-09 NOTE — ED Provider Notes (Signed)
Emergency Department Provider Note   I have reviewed the triage vital signs and the nursing notes.   HISTORY  Chief Complaint Hematuria   HPI Joshua Pittman is a 76 y.o. male with PMH of prostate cancer presents to the emergency department for evaluation of blood in the urine that started abruptly yesterday. The patient describes sudden passage of bright red blood mixed with urine. He describes a "stinging" sensation with urination. No fever or chills. No hesitancy or urgency. No blood in urine since yesterday. This morning he is urinating normally with no symptoms of urinary retention. No lower abdominal pain. No radiation of symptoms.    Past Medical History:  Diagnosis Date  . Arrhythmia   . Prostate cancer ([place]) 02/18/14   gleason 8    Patient Active Problem List   Diagnosis Date Noted  . Prostate cancer ([place]) 04/14/2014  . Malignant neoplasm of prostate ([place]) 03/27/2014    Past Surgical History:  Procedure Laterality Date  . INGUINAL HERNIA REPAIR     x 2  . LYMPHADENECTOMY Bilateral 04/14/2014   Procedure: LYMPHADENECTOMY;  Surgeon: [name], MD;  Location: [facility];  Service: Urology;  Laterality: Bilateral;  . PROSTATE BIOPSY  02/18/14   gleason 8, vol 50 cc  . ROBOT ASSISTED LAPAROSCOPIC RADICAL PROSTATECTOMY N/A 04/14/2014   Procedure: ROBOTIC ASSISTED LAPAROSCOPIC RADICAL PROSTATECTOMY LEVEL 3;  Surgeon: [name], MD;  Location: [facility];  Service: Urology;  Laterality: N/A;      Allergies Patient has no known allergies.  Family History  Problem Relation Age of Onset  . Asthma Mother   . Cancer Neg Hx     Social History Social History  Substance Use Topics  . Smoking status: Current Every Day Smoker    Packs/day: 0.25    Years: 40.00    Types: Cigarettes  . Smokeless tobacco: Never Used     Comment: 3-4 cigs daily  . Alcohol use Yes     Comment: beer socially on the weekends    Review of Systems  Constitutional: No  fever/chills Eyes: No visual changes. ENT: No sore throat. Cardiovascular: Denies chest pain. Respiratory: Denies shortness of breath. Gastrointestinal: No abdominal pain.  No nausea, no vomiting.  No diarrhea.  No constipation. Genitourinary: Negative for dysuria. Positive blood in urine.  Musculoskeletal: Negative for back pain. Skin: Negative for rash. Neurological: Negative for headaches, focal weakness or numbness.  10-point ROS otherwise negative.  ____________________________________________   PHYSICAL EXAM:  VITAL SIGNS: ED Triage Vitals [02/09/17 0952]  Enc Vitals Group     BP (!) 148/68     Pulse Rate (!) 55     Resp 16     Temp 98.1 F (36.7 C)     Temp Source Oral     SpO2 100 %   Constitutional: Alert and oriented. Well appearing and in no acute distress. Eyes: Conjunctivae are normal.  Head: Atraumatic. Nose: No congestion/rhinnorhea. Mouth/Throat: Mucous membranes are moist. Neck: No stridor.  Cardiovascular: Normal rate, regular rhythm. Good peripheral circulation. Grossly normal heart sounds.   Respiratory: Normal respiratory effort.  No retractions. Lungs CTAB. Gastrointestinal: Soft and nontender. No distention.  Musculoskeletal: No lower extremity tenderness nor edema. No gross deformities of extremities. Neurologic:  Normal speech and language. No gross focal neurologic deficits are appreciated.  Skin:  Skin is warm, dry and intact. No rash noted.   ____________________________________________   LABS (all labs ordered are listed, but only abnormal results are displayed)  Labs Reviewed  CBC WITH DIFFERENTIAL/PLATELET - Abnormal; Notable for the following:       Result Value   RBC 4.01 (*)    Hemoglobin 12.8 (*)    HCT 38.0 (*)    All other components within normal limits  BASIC METABOLIC PANEL - Abnormal; Notable for the following:    Anion gap 4 (*)    All other components within normal limits  URINE CULTURE  URINALYSIS, ROUTINE W REFLEX  MICROSCOPIC   ____________________________________________  RADIOLOGY  None ____________________________________________   PROCEDURES  Procedure(s) performed:   Procedures  None ____________________________________________   INITIAL IMPRESSION / ASSESSMENT AND PLAN / ED COURSE  Pertinent labs & imaging results that were available during my care of the patient were reviewed by me and considered in my medical decision making (see chart for details).  Patient presents to the right suprahilar for evaluation of blood in the urine last night that has since resolved. No urinary obstruction symptoms. He does have mild burning sensation with urination. Plan for UA to assess for infection. Does have history of prostate cancer with last chemotherapy one year prior.   12:50 PM No hematuria or UTI on UA. Plan for discharge with Urology for evaluation given history of Prostate Cancer.   At this time, I do not feel there is any life-threatening condition present. I have reviewed and discussed all results (EKG, imaging, lab, urine as appropriate), exam findings with patient. I have reviewed nursing notes and appropriate previous records.  I feel the patient is safe to be discharged home without further emergent workup. Discussed usual and customary return precautions. Patient and family (if present) verbalize understanding and are comfortable with this plan.  Patient will follow-up with their primary care provider. If they do not have a primary care provider, information for follow-up has been provided to them. All questions have been answered.  ____________________________________________  FINAL CLINICAL IMPRESSION(S) / ED DIAGNOSES  Final diagnoses:  Hematuria, unspecified type  Dysuria     MEDICATIONS GIVEN DURING THIS VISIT:  Medications - No data to display   NEW OUTPATIENT MEDICATIONS STARTED DURING THIS VISIT:  There are no discharge medications for this  patient.     Note:  This document was prepared using Dragon voice recognition software and may include unintentional dictation errors.  Nanda Quinton, MD Emergency Medicine   Long, [facility], MD 02/09/17 [phone]

## 2017-02-09 NOTE — ED Triage Notes (Signed)
Pt states that yesterday he had one episode of blood in his urine. Hx of prostate CA that he finished chemo for last year. Denies pain. Alert and oriented. Spanish speaking.

## 2017-02-10 LAB — URINE CULTURE: CULTURE: NO GROWTH

## 2017-12-02 ENCOUNTER — Emergency Department (HOSPITAL_COMMUNITY): Payer: Medicaid Other

## 2017-12-02 ENCOUNTER — Encounter (HOSPITAL_COMMUNITY): Payer: Self-pay | Admitting: Emergency Medicine

## 2017-12-02 ENCOUNTER — Emergency Department (HOSPITAL_COMMUNITY)
Admission: EM | Admit: 2017-12-02 | Discharge: 2017-12-02 | Disposition: A | Discharge status: Home / Self Care | Payer: Medicaid Other | Attending: Emergency Medicine | Admitting: Emergency Medicine

## 2017-12-02 DIAGNOSIS — Y998 Other external cause status: Secondary | ICD-10-CM | POA: Diagnosis not present

## 2017-12-02 DIAGNOSIS — Y929 Unspecified place or not applicable: Secondary | ICD-10-CM | POA: Insufficient documentation

## 2017-12-02 DIAGNOSIS — Y939 Activity, unspecified: Secondary | ICD-10-CM | POA: Insufficient documentation

## 2017-12-02 DIAGNOSIS — L03116 Cellulitis of left lower limb: Secondary | ICD-10-CM | POA: Diagnosis not present

## 2017-12-02 DIAGNOSIS — Z8546 Personal history of malignant neoplasm of prostate: Secondary | ICD-10-CM | POA: Insufficient documentation

## 2017-12-02 DIAGNOSIS — F17210 Nicotine dependence, cigarettes, uncomplicated: Secondary | ICD-10-CM | POA: Insufficient documentation

## 2017-12-02 DIAGNOSIS — L03119 Cellulitis of unspecified part of limb: Secondary | ICD-10-CM

## 2017-12-02 DIAGNOSIS — S92422A Displaced fracture of distal phalanx of left great toe, initial encounter for closed fracture: Secondary | ICD-10-CM

## 2017-12-02 DIAGNOSIS — S92492A Other fracture of left great toe, initial encounter for closed fracture: Secondary | ICD-10-CM | POA: Insufficient documentation

## 2017-12-02 DIAGNOSIS — W231XXA Caught, crushed, jammed, or pinched between stationary objects, initial encounter: Secondary | ICD-10-CM | POA: Insufficient documentation

## 2017-12-02 DIAGNOSIS — S99922A Unspecified injury of left foot, initial encounter: Secondary | ICD-10-CM | POA: Diagnosis present

## 2017-12-02 MED ORDER — SULFAMETHOXAZOLE-TRIMETHOPRIM 800-160 MG PO TABS: 1.0000 | ORAL_TABLET | Freq: Two times a day (BID) | ORAL | 0 refills | Status: AC

## 2017-12-02 NOTE — ED Notes (Signed)
Patient transported to X-ray 

## 2017-12-02 NOTE — ED Triage Notes (Signed)
Patient to ED c/o L foot and great toe pain after stubbing it against something yesterday. Swelling noted to medial side of foot. Ambulatory with steady gait, reports painful to walk.

## 2017-12-02 NOTE — ED Provider Notes (Signed)
[facility] EMERGENCY DEPARTMENT Provider Note   CSN: [id number] Arrival date & time: 12/02/17  0950     History   Chief Complaint Chief Complaint  Patient presents with  . Foot Pain    HPI Joshua Pittman is a 77 y.o. male with h/o prostate cancer, tobacco use, here for evaluation of pain to left great toe onset yesterday after he stubbed his toe against a tree root. Pain was sudden, moderate, gradually worsening. He took ibuprofen with mild relief. Woke up this morning and noticed local swelling and redness to left mid foot. He is ambulatory with cane at baseline and able to walk normally but with pain to left great toe/mid foot. He denies any previous injuries to this area or surgeries. No h/o gout, IVDU, septic arthritis. Denies fevers, chills, calf pain or swelling. Had a PET scan for f/u on prostate cancer 2 months ago that was normal.   HPI  Past Medical History:  Diagnosis Date  . Arrhythmia   . Prostate cancer ([place]) 02/18/14   gleason 8    Patient Active Problem List   Diagnosis Date Noted  . Prostate cancer ([place]) 04/14/2014  . Malignant neoplasm of prostate ([place]) 03/27/2014    Past Surgical History:  Procedure Laterality Date  . INGUINAL HERNIA REPAIR     x 2  . LYMPHADENECTOMY Bilateral 04/14/2014   Procedure: LYMPHADENECTOMY;  Surgeon: [name], MD;  Location: [facility];  Service: Urology;  Laterality: Bilateral;  . PROSTATE BIOPSY  02/18/14   gleason 8, vol 50 cc  . ROBOT ASSISTED LAPAROSCOPIC RADICAL PROSTATECTOMY N/A 04/14/2014   Procedure: ROBOTIC ASSISTED LAPAROSCOPIC RADICAL PROSTATECTOMY LEVEL 3;  Surgeon: [name], MD;  Location: [facility];  Service: Urology;  Laterality: N/A;        Home Medications    Prior to Admission medications   Medication Sig Start Date End Date Taking? Authorizing Provider  sulfamethoxazole-trimethoprim (BACTRIM DS,SEPTRA DS) 800-160 MG tablet Take 1 tablet by mouth 2 (two) times daily for 7  days. 12/02/17 12/09/17  Kinnie Feil, PA-C    Family History Family History  Problem Relation Age of Onset  . Asthma Mother   . Cancer Neg Hx     Social History Social History   Tobacco Use  . Smoking status: Current Every Day Smoker    Packs/day: 0.25    Years: 40.00    Pack years: 10.00    Types: Cigarettes  . Smokeless tobacco: Never Used  . Tobacco comment: 3-4 cigs daily  Substance Use Topics  . Alcohol use: Yes    Comment: beer socially on the weekends  . Drug use: No     Allergies   Patient has no known allergies.   Review of Systems Review of Systems  Musculoskeletal: Positive for arthralgias, gait problem and joint swelling.  Skin: Positive for color change.  All other systems reviewed and are negative.    Physical Exam Updated Vital Signs BP (!) 149/81 (BP Location: Right Arm)   Pulse 72   Temp 97.8 F (36.6 C) (Oral)   Resp 17   SpO2 100%   Physical Exam  Constitutional: He is oriented to person, place, and time. He appears well-developed and well-nourished.  Non-toxic appearance.  HENT:  Head: Normocephalic.  Right Ear: External ear normal.  Left Ear: External ear normal.  Nose: Nose normal.  Eyes: Conjunctivae and EOM are normal.  Neck: Full passive range of motion without pain.  Cardiovascular: Normal rate.  2+  Dp and Pt pulses bilaterally. No calf tenderness or asymmetric edema  Pulmonary/Chest: Effort normal. No tachypnea. No respiratory distress.  Musculoskeletal: Normal range of motion. He exhibits edema and tenderness.  Focal tenderness to left great toe distal phalanx and IP joint, mild pain with PROM of great toe.  Pain with light graze of skin at great toe MTP with mild erythema, edema and obvious warmth when compared to other extremity. Erythema slightly extend up into medial mid foot.   Neurological: He is alert and oriented to person, place, and time.  Sensation to light touch intact in feet   Skin: Skin is warm and dry.  Capillary refill takes less than 2 seconds.  Psychiatric: His behavior is normal. Thought content normal.       ED Treatments / Results  Labs (all labs ordered are listed, but only abnormal results are displayed) Labs Reviewed - No data to display  EKG None  Radiology Dg Foot Complete Left  Result Date: 12/02/2017 CLINICAL DATA:  Left foot and great toe pain, injury EXAM: LEFT FOOT - COMPLETE 3+ VIEW COMPARISON:  None. FINDINGS: There is cortical irregularity in the proximal aspect of the left great toe distal phalanx, best seen on the lateral view which may reflect nondisplaced fracture. No additional acute bony abnormality. Joint spaces are maintained. IMPRESSION: Questionable nondisplaced fracture in the proximal aspect of the distal left great toe phalanx, seen best on the lateral view. Electronically Signed   By: Rolm Baptise M.D.   On: 12/02/2017 10:35    Procedures Procedures (including critical care time)  Medications Ordered in ED Medications - No data to display   Initial Impression / Assessment and Plan / ED Course  I have reviewed the triage vital signs and the nursing notes.  Pertinent labs & imaging results that were available during my care of the patient were reviewed by me and considered in my medical decision making (see chart for details).  Clinical Course as of Dec 03 1307  Sat Dec 02, 2017  1242 FINDINGS: There is cortical irregularity in the proximal aspect of the left great toe distal phalanx, best seen on the lateral view which may reflect nondisplaced fracture. No additional acute bony abnormality. Joint spaces are maintained.    DG Foot Complete Left [CG]    Clinical Course User Index [CG] Kinnie Feil, PA-C    77 yo M here with traumatic left great toe pain. X-ray shows non displaced fx of distal phalanx.  On exam, he was noted to have mild erythema, but very obvious warmth and edema to left MTP extending into mid foot. Erythema not  well visualized on photo. Considering cellulitis.  Non displaced fracture to distal phalanx of toe should not be causing erythema, warmth and edema to mid foot. He has no h/o gout. No IVUD and he has minimal pain with PROM of the MTP. Doubt this is septic arthritis. No osteo on x-ray. He has no calf tenderness or asymmetric edema to suggest DVT. Given age, risk will treat for cellulitis. I explained diagnosis to pt and family at bedside. I marked edges of erythema and dated. He is to monitor for increased redness, warmth, pain, swelling, fevers, chills in the next 48 hours of antibiotics. Discussed return precautions. He is in agreement. Great toe buddy tapes and placed in hard post op shoe. He is ambulatory at baseline after dressing. F/u with PCP for re-evaluation of toe if pain does not improve/resolve in 1-2 weeks.  Final Clinical  Impressions(s) / ED Diagnoses   Final diagnoses:  Closed fracture of distal phalanx of left great toe, initial encounter  Cellulitis of foot, except toe    ED Discharge Orders        Ordered    sulfamethoxazole-trimethoprim (BACTRIM DS,SEPTRA DS) 800-160 MG tablet  2 times daily     12/02/17 1306       Kinnie Feil, PA-C 12/02/17 1309    Lajean Saver, MD 12/02/17 1441

## 2017-12-02 NOTE — Discharge Instructions (Signed)
You have a stable fracture to the tip of your left great toe. Wear your buddy taping and hard shoe for at least 1 week, remove and assess if there is ongoing pain. If there is ongoing pain, place shoe back on and wear for another 1 week.  You may return to normal activities when there is no more toe pain with walking.  If pain persists after 2 week, follow up with primary care doctor for re-evaluation.   You were also noted to have redness, warmth, swelling to your foot. This is most likely cellulitis, a superficial skin infection.  Take antibiotics as prescribed. Return to the ER if there is increased redness, warmth, swelling past the pen marks or if there is fevers, chills, leg swelling or calf pain.

## 2019-10-10 ENCOUNTER — Emergency Department (HOSPITAL_COMMUNITY)
Admission: EM | Admit: 2019-10-10 | Discharge: 2019-10-11 | Disposition: A | Discharge status: Home / Self Care | Payer: Medicaid Other | Attending: Emergency Medicine | Admitting: Emergency Medicine

## 2019-10-10 ENCOUNTER — Encounter (HOSPITAL_COMMUNITY): Payer: Self-pay | Admitting: Emergency Medicine

## 2019-10-10 ENCOUNTER — Other Ambulatory Visit: Payer: Self-pay

## 2019-10-10 ENCOUNTER — Emergency Department (HOSPITAL_COMMUNITY): Payer: Medicaid Other

## 2019-10-10 DIAGNOSIS — M7062 Trochanteric bursitis, left hip: Secondary | ICD-10-CM | POA: Insufficient documentation

## 2019-10-10 DIAGNOSIS — Z8546 Personal history of malignant neoplasm of prostate: Secondary | ICD-10-CM | POA: Diagnosis not present

## 2019-10-10 DIAGNOSIS — F17210 Nicotine dependence, cigarettes, uncomplicated: Secondary | ICD-10-CM | POA: Insufficient documentation

## 2019-10-10 DIAGNOSIS — Y939 Activity, unspecified: Secondary | ICD-10-CM | POA: Diagnosis not present

## 2019-10-10 DIAGNOSIS — M1611 Unilateral primary osteoarthritis, right hip: Secondary | ICD-10-CM | POA: Diagnosis not present

## 2019-10-10 DIAGNOSIS — M25552 Pain in left hip: Secondary | ICD-10-CM | POA: Diagnosis present

## 2019-10-10 MED ORDER — HYDROCODONE-ACETAMINOPHEN 5-325 MG PO TABS: 0.5000 | ORAL_TABLET | Freq: Four times a day (QID) | ORAL | 0 refills | Status: DC | PRN

## 2019-10-10 MED ORDER — HYDROCODONE-ACETAMINOPHEN 5-325 MG PO TABS
1.0000 | ORAL_TABLET | Freq: Once | ORAL | Status: AC
  Administered 2019-10-11: 1 via ORAL
  Filled 2019-10-10: qty 1

## 2019-10-10 MED ORDER — MELOXICAM 7.5 MG PO TABS: ORAL_TABLET | ORAL | 0 refills | Status: DC

## 2019-10-10 NOTE — ED Notes (Signed)
Pt transported to radiology.

## 2019-10-10 NOTE — ED Triage Notes (Signed)
Patient complaining of right and left hip pain. Patient uses a cane to walk. Patient states the [place] dr diagnosed him with a fissure. Patient states that his hip is in pain.

## 2019-10-10 NOTE — ED Provider Notes (Signed)
[facility] DEPT Provider Note: Joshua Spurling, MD, FACEP  CSN: [id number] MRN: [id number] ARRIVAL: 10/10/19 at 2136 ROOM: [place]  Hip Pain   HISTORY OF PRESENT ILLNESS  10/10/19 10:58 PM Joshua Pittman is a 79 y.o. male who complains of pain in his left hip (greater trochanter) radiating down to his left knee.  He attributes this to his second Covid shot which she received on September 20, 2019.  He rates the pain as a 10 out of 10, worse with movement and certain positions.  He denies any associated numbness or weakness.  He denies injury.  He describes the pain as sharp and shooting.  He denies any other acute complaint.  He has a history of "fissure" and a bone in his left knee but states that healed years ago.  He also has a history of prostate cancer but states that that too is under control.  He was recently seen by his PCP who started him on blood pressure medications and tramadol.  The tramadol has not been controlling his pain adequately.   Past Medical History:  Diagnosis Date  . Arrhythmia   . Prostate cancer ([place]) 02/18/14   gleason 8    Past Surgical History:  Procedure Laterality Date  . INGUINAL HERNIA REPAIR     x 2  . LYMPHADENECTOMY Bilateral 04/14/2014   Procedure: LYMPHADENECTOMY;  Surgeon: [name], MD;  Location: [facility];  Service: Urology;  Laterality: Bilateral;  . PROSTATE BIOPSY  02/18/14   gleason 8, vol 50 cc  . ROBOT ASSISTED LAPAROSCOPIC RADICAL PROSTATECTOMY N/A 04/14/2014   Procedure: ROBOTIC ASSISTED LAPAROSCOPIC RADICAL PROSTATECTOMY LEVEL 3;  Surgeon: [name], MD;  Location: [facility];  Service: Urology;  Laterality: N/A;    Family History  Problem Relation Age of Onset  . Asthma Mother   . Cancer Neg Hx     Social History   Tobacco Use  . Smoking status: Current Every Day Smoker    Packs/day: 0.25    Years: 40.00    Pack years: 10.00    Types: Cigarettes  . Smokeless tobacco: Never Used  . Tobacco  comment: 3-4 cigs daily  Substance Use Topics  . Alcohol use: Yes    Comment: beer socially on the weekends  . Drug use: No    Prior to Admission medications   Medication Sig Start Date End Date Taking? Authorizing Provider  HYDROcodone-acetaminophen (NORCO) 5-325 MG tablet Take 0.5-1 tablets by mouth every 6 (six) hours as needed for severe pain. 10/10/19   Chizara Mena, MD  meloxicam (MOBIC) 7.5 MG tablet Take 1 tablet daily for hip pain. 10/10/19   Weslynn Ke, Jenny Reichmann, MD    Allergies Patient has no known allergies.   REVIEW OF SYSTEMS  Negative except as noted here or in the History of Present Illness.   PHYSICAL EXAMINATION  Initial Vital Signs Blood pressure (!) 184/96, pulse 71, temperature 98.6 F (37 C), temperature source Oral, resp. rate 18, height 5' 8.9" (1.75 m), weight 67.6 kg, SpO2 99 %.  Examination General: Well-developed, well-nourished male in no acute distress; appearance consistent with age of record HENT: normocephalic; atraumatic Eyes: Arcus senilis bilaterally; extraocular muscles grossly intact Neck: supple Heart: regular rate and rhythm Lungs: clear to auscultation bilaterally Abdomen: soft; nondistended; nontender; bowel sounds present Extremities: Arthritic changes; tenderness over left greater trochanter with pain on movement of left hip; pulses normal Neurologic: Awake, alert; motor function intact in all extremities and symmetric; no facial droop  Skin: Warm and dry Psychiatric: Normal mood and affect   RESULTS  Summary of this visit's results, reviewed and interpreted by myself:   EKG Interpretation  Date/Time:    Ventricular Rate:    PR Interval:    QRS Duration:   QT Interval:    QTC Calculation:   R Axis:     Text Interpretation:        Laboratory Studies: No results found for this or any previous visit (from the past 24 hour(s)). Imaging Studies: DG Hip Unilat W or Wo Pelvis 2-3 Views Left  Result Date: 10/10/2019 CLINICAL DATA:   79 year old male with hip pain. History of prostate cancer. No trauma. EXAM: DG HIP (WITH OR WITHOUT PELVIS) 2-3V LEFT COMPARISON:  CT abdomen pelvis dated 07/26/2017. FINDINGS: There is no acute fracture or dislocation. The bones are osteopenic. There is a geographic sclerotic area involving the right pelvic bone and right acetabulum as seen on the prior CT most consistent with changes of Paget's. There is severe osteoarthritic changes of the right hip with joint space narrowing and subcortical cysts and bone-on-bone contact. Degenerative changes of the lower lumbar spine. The soft tissues are unremarkable. Vascular calcifications. IMPRESSION: 1. No acute fracture or dislocation. 2. 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 Electronically Signed   By: Anner Crete M.D.   On: 10/10/2019 23:37    ED COURSE and MDM  Nursing notes, initial and subsequent vitals signs, including pulse oximetry, reviewed and interpreted by myself.  Vitals:   10/10/19 2213  BP: (!) 184/96  Pulse: 71  Resp: 18  Temp: 98.6 F (37 C)  TempSrc: Oral  SpO2: 99%  Weight: 67.6 kg  Height: 5' 8.9" (1.75 m)   Medications  HYDROcodone-acetaminophen (NORCO/VICODIN) 5-325 MG per tablet 1 tablet (has no administration in time range)    11:46 PM Given location of pain I suspect trochanteric bursitis.  We will treat with low-dose Mobic and hydrocodone and refer to orthopedics for definitive treatment.  PROCEDURES  Procedures   ED DIAGNOSES     ICD-10-CM   1. Trochanteric bursitis of left hip  M70.62   2. Primary osteoarthritis of right hip  M16.11        Payten Hobin, MD 10/10/19 2350

## 2019-10-27 ENCOUNTER — Other Ambulatory Visit: Payer: Self-pay

## 2019-10-27 ENCOUNTER — Emergency Department (HOSPITAL_COMMUNITY)
Admission: EM | Admit: 2019-10-27 | Discharge: 2019-10-27 | Disposition: A | Discharge status: Home / Self Care | Payer: Medicaid Other | Attending: Emergency Medicine | Admitting: Emergency Medicine

## 2019-10-27 DIAGNOSIS — M79605 Pain in left leg: Secondary | ICD-10-CM | POA: Insufficient documentation

## 2019-10-27 DIAGNOSIS — M25552 Pain in left hip: Secondary | ICD-10-CM | POA: Diagnosis not present

## 2019-10-27 DIAGNOSIS — Z8546 Personal history of malignant neoplasm of prostate: Secondary | ICD-10-CM | POA: Insufficient documentation

## 2019-10-27 DIAGNOSIS — I10 Essential (primary) hypertension: Secondary | ICD-10-CM | POA: Diagnosis not present

## 2019-10-27 LAB — CBC WITH DIFFERENTIAL/PLATELET
Abs Immature Granulocytes: 0.05 10*3/uL (ref 0.00–0.07)
Basophils Absolute: 0 10*3/uL (ref 0.0–0.1)
Basophils Relative: 0 %
Eosinophils Absolute: 0 10*3/uL (ref 0.0–0.5)
Eosinophils Relative: 0 %
HCT: 39.1 % (ref 39.0–52.0)
Hemoglobin: 13.1 g/dL (ref 13.0–17.0)
Immature Granulocytes: 1 %
Lymphocytes Relative: 7 %
Lymphs Abs: 0.5 10*3/uL — ABNORMAL LOW (ref 0.7–4.0)
MCH: 31.6 pg (ref 26.0–34.0)
MCHC: 33.5 g/dL (ref 30.0–36.0)
MCV: 94.4 fL (ref 80.0–100.0)
Monocytes Absolute: 1 10*3/uL (ref 0.1–1.0)
Monocytes Relative: 13 %
Neutro Abs: 6.4 10*3/uL (ref 1.7–7.7)
Neutrophils Relative %: 79 %
Platelets: 326 10*3/uL (ref 150–400)
RBC: 4.14 MIL/uL — ABNORMAL LOW (ref 4.22–5.81)
RDW: 11.8 % (ref 11.5–15.5)
WBC: 8 10*3/uL (ref 4.0–10.5)
nRBC: 0 % (ref 0.0–0.2)

## 2019-10-27 LAB — COMPREHENSIVE METABOLIC PANEL
ALT: 13 U/L (ref 0–44)
AST: 16 U/L (ref 15–41)
Albumin: 3.6 g/dL (ref 3.5–5.0)
Alkaline Phosphatase: 85 U/L (ref 38–126)
Anion gap: 7 (ref 5–15)
BUN: 17 mg/dL (ref 8–23)
CO2: 27 mmol/L (ref 22–32)
Calcium: 9 mg/dL (ref 8.9–10.3)
Chloride: 99 mmol/L (ref 98–111)
Creatinine, Ser: 0.81 mg/dL (ref 0.61–1.24)
GFR calc Af Amer: >60 mL/min (ref >60)
GFR calc non Af Amer: >60 mL/min (ref >60)
Glucose, Bld: 237 mg/dL — ABNORMAL HIGH (ref 70–99)
Potassium: 4.4 mmol/L (ref 3.5–5.1)
Sodium: 133 mmol/L — ABNORMAL LOW (ref 135–145)
Total Bilirubin: 0.6 mg/dL (ref 0.3–1.2)
Total Protein: 6.8 g/dL (ref 6.5–8.1)

## 2019-10-27 LAB — CK: Total CK: 73 U/L (ref 49–397)

## 2019-10-27 LAB — PSA: Prostatic Specific Antigen: <0.01 ng/mL (ref 0.00–4.00)

## 2019-10-27 MED ORDER — HYDROMORPHONE HCL 2 MG PO TABS: 1.0000 mg | ORAL_TABLET | Freq: Three times a day (TID) | ORAL | 0 refills | Status: DC | PRN

## 2019-10-27 MED ORDER — HYDROMORPHONE HCL 1 MG/ML IJ SOLN
0.5000 mg | Freq: Once | INTRAMUSCULAR | Status: AC
  Administered 2019-10-27: 0.5 mg via INTRAMUSCULAR
  Filled 2019-10-27: qty 1

## 2019-10-27 MED ORDER — KETOROLAC TROMETHAMINE 30 MG/ML IJ SOLN
15.0000 mg | Freq: Once | INTRAMUSCULAR | Status: AC
  Administered 2019-10-27: 16:00:00 15 mg via INTRAMUSCULAR
  Filled 2019-10-27: qty 1

## 2019-10-27 MED ORDER — PREDNISONE 20 MG PO TABS: 40.0000 mg | ORAL_TABLET | Freq: Every day | ORAL | 0 refills | Status: DC

## 2019-10-27 NOTE — ED Provider Notes (Signed)
[facility] DEPT Provider Note   CSN: [id number] Arrival date & time: 10/27/19  1444     History Chief Complaint  Patient presents with  . Leg Pain    Left     Joshua Pittman is a 79 y.o. male.  HPI   Pleasant adult male presents with his son who assists with the HPI. History is obtained via translator device. Patient presents with concern for ongoing/worsening left hip pain. Patient was seen and evaluated here 2 weeks ago.  He had x-ray performed, started on Mobic, Norco.  He notes that in spite of these medications he continues to have pain that is worsening.  Distribution is slightly enlarged since that evaluation.  The pain is throughout the left hip with radiation inferiorly down the anterior lateral aspect of the left thigh. There is minimal low back pain, and essentially no pain distal to the left knee. Patient has multiple medical issues including prostate cancer, now status post therapy, and with no known recurrence over the last 5 years or so. Patient also has history of fall, with right hip lesions, though he currently has no right hip pain. This left-sided pain began at an unspecified time, but became worse in the weeks prior to that last evaluation. He has been seen and evaluated both here, and previously with his physician in [place]. He immigrated here 8 years ago.  Past Medical History:  Diagnosis Date  . Arrhythmia   . Prostate cancer ([place]) 02/18/14   gleason 8    Patient Active Problem List   Diagnosis Date Noted  . Prostate cancer ([place]) 04/14/2014  . Malignant neoplasm of prostate ([facility]) 03/27/2014    Past Surgical History:  Procedure Laterality Date  . INGUINAL HERNIA REPAIR     x 2  . LYMPHADENECTOMY Bilateral 04/14/2014   Procedure: LYMPHADENECTOMY;  Surgeon: [name], MD;  Location: [facility];  Service: Urology;  Laterality: Bilateral;  . PROSTATE BIOPSY  02/18/14   gleason 8, vol 50 cc  . ROBOT ASSISTED  LAPAROSCOPIC RADICAL PROSTATECTOMY N/A 04/14/2014   Procedure: ROBOTIC ASSISTED LAPAROSCOPIC RADICAL PROSTATECTOMY LEVEL 3;  Surgeon: [name], MD;  Location: [facility];  Service: Urology;  Laterality: N/A;       Family History  Problem Relation Age of Onset  . Asthma Mother   . Cancer Neg Hx     Social History   Tobacco Use  . Smoking status: Current Every Day Smoker    Packs/day: 0.25    Years: 40.00    Pack years: 10.00    Types: Cigarettes  . Smokeless tobacco: Never Used  . Tobacco comment: 3-4 cigs daily  Substance Use Topics  . Alcohol use: Yes    Comment: beer socially on the weekends  . Drug use: No    Home Medications Prior to Admission medications   Medication Sig Start Date End Date Taking? Authorizing Provider  HYDROcodone-acetaminophen (NORCO) 5-325 MG tablet Take 0.5-1 tablets by mouth every 6 (six) hours as needed for severe pain. 10/10/19   Molpus, John, MD  meloxicam (MOBIC) 7.5 MG tablet Take 1 tablet daily for hip pain. 10/10/19   Molpus, Jenny Reichmann, MD    Allergies    Patient has no known allergies.  Review of Systems   Review of Systems  Constitutional:       Per HPI, otherwise negative  HENT:       Per HPI, otherwise negative  Respiratory:       Per HPI, otherwise negative  Cardiovascular:       Per HPI, otherwise negative  Gastrointestinal: Negative for vomiting.  Endocrine:       Negative aside from HPI  Genitourinary:       Neg aside from HPI   Musculoskeletal:       Per HPI, otherwise negative  Skin: Negative.   Neurological: Negative for syncope.    Physical Exam Updated Vital Signs BP (!) 166/82   Pulse 82   Temp 98.6 F (37 C) (Oral)   Resp 18   SpO2 98%   Physical Exam Vitals and nursing note reviewed.  Constitutional:      General: He is not in acute distress.    Appearance: He is well-developed.  HENT:     Head: Normocephalic and atraumatic.  Eyes:     Conjunctiva/sclera: Conjunctivae normal.  Cardiovascular:      Rate and Rhythm: Normal rate and regular rhythm.  Pulmonary:     Effort: Pulmonary effort is normal. No respiratory distress.     Breath sounds: No stridor.  Abdominal:     General: There is no distension.  Musculoskeletal:       Legs:  Skin:    General: Skin is warm and dry.  Neurological:     Mental Status: He is alert and oriented to person, place, and time.     ED Results / Procedures / Treatments   Labs (all labs ordered are listed, but only abnormal results are displayed) Labs Reviewed  COMPREHENSIVE METABOLIC PANEL - Abnormal; Notable for the following components:      Result Value   Sodium 133 (*)    Glucose, Bld 237 (*)    All other components within normal limits  CBC WITH DIFFERENTIAL/PLATELET - Abnormal; Notable for the following components:   RBC 4.14 (*)    Lymphs Abs 0.5 (*)    All other components within normal limits  CK  PSA     Radiology I reviewed the radiology studies from his most recent visit, notable for arthritic changes, as well as prior studies including PET scan with questionable lytic lesions in his right ilium. Procedures Procedures (including critical care time)  Medications Ordered in ED Medications  ketorolac (TORADOL) 30 MG/ML injection 15 mg (15 mg Intramuscular Given 10/27/19 1614)  HYDROmorphone (DILAUDID) injection 0.5 mg (0.5 mg Intramuscular Given 10/27/19 1614)    ED Course  I have reviewed the triage vital signs and the nursing notes.  Pertinent labs & imaging results that were available during my care of the patient were reviewed by me and considered in my medical decision making (see chart for details).    MDM Rules/Calculators/A&P                      5:47 PM On repeat exam patient is awake, alert, states that he feels better, he is moving his leg more freely, smiling. Together with his son we discussed lab findings, which generally reassuring, no substantial renal dysfunction.  PSA test was sent, to facilitate  outpatient follow-up with urology given his history of prostate cancer.  But x-ray does not demonstrate lytic lesions, but does have arthritic changes, likely contributing to his pain. Given his substantial improvement here absence of distal neurovascular compromise, systemic complaints, reassuring labs, absence of bacteremia, sepsis, patient is appropriate for close outpatient follow-up. He and his son are both aware of the need for this, both with orthopedics, and with urology.  Final Clinical Impression(s) / ED Diagnoses Final diagnoses:  Left leg pain     Carmin Muskrat, MD 10/27/19 1752

## 2019-10-27 NOTE — ED Triage Notes (Signed)
Pt arrived in wheelchair into ED CC leg pain X 2-3 weeks.. Pt was seen 4/22 without relief. Pt normally walks with a cane. Pts son at beside. [facility] provider at bedside.    VSS A febrile

## 2019-10-27 NOTE — ED Notes (Signed)
Pt verbalizes understanding of DC instructions. Pt belongings returned and is assisted in WC out of ED.   

## 2019-10-27 NOTE — Discharge Instructions (Addendum)
Llame a nuestros especialistas para recibir la atencin de [place].  Regrese aqu para cualquier inquietud sobre cambios en su condicin.

## 2019-11-07 ENCOUNTER — Emergency Department (HOSPITAL_COMMUNITY): Payer: Medicaid Other

## 2019-11-07 ENCOUNTER — Encounter (HOSPITAL_COMMUNITY): Payer: Self-pay | Admitting: Emergency Medicine

## 2019-11-07 ENCOUNTER — Other Ambulatory Visit: Payer: Self-pay

## 2019-11-07 ENCOUNTER — Inpatient Hospital Stay (HOSPITAL_COMMUNITY)
Admission: EM | Admit: 2019-11-07 | Discharge: 2019-11-09 | DRG: 544 | Disposition: A | Discharge status: Home / Self Care | Payer: Medicaid Other | Attending: Internal Medicine | Admitting: Internal Medicine

## 2019-11-07 DIAGNOSIS — M6289 Other specified disorders of muscle: Secondary | ICD-10-CM | POA: Diagnosis present

## 2019-11-07 DIAGNOSIS — R52 Pain, unspecified: Secondary | ICD-10-CM | POA: Diagnosis not present

## 2019-11-07 DIAGNOSIS — M1611 Unilateral primary osteoarthritis, right hip: Secondary | ICD-10-CM | POA: Diagnosis present

## 2019-11-07 DIAGNOSIS — Z79891 Long term (current) use of opiate analgesic: Secondary | ICD-10-CM

## 2019-11-07 DIAGNOSIS — M47816 Spondylosis without myelopathy or radiculopathy, lumbar region: Secondary | ICD-10-CM | POA: Diagnosis present

## 2019-11-07 DIAGNOSIS — Z791 Long term (current) use of non-steroidal anti-inflammatories (NSAID): Secondary | ICD-10-CM

## 2019-11-07 DIAGNOSIS — I160 Hypertensive urgency: Secondary | ICD-10-CM | POA: Diagnosis present

## 2019-11-07 DIAGNOSIS — Z20822 Contact with and (suspected) exposure to covid-19: Secondary | ICD-10-CM | POA: Diagnosis present

## 2019-11-07 DIAGNOSIS — Z923 Personal history of irradiation: Secondary | ICD-10-CM

## 2019-11-07 DIAGNOSIS — F17210 Nicotine dependence, cigarettes, uncomplicated: Secondary | ICD-10-CM | POA: Diagnosis present

## 2019-11-07 DIAGNOSIS — M79652 Pain in left thigh: Secondary | ICD-10-CM | POA: Diagnosis present

## 2019-11-07 DIAGNOSIS — M8580 Other specified disorders of bone density and structure, unspecified site: Secondary | ICD-10-CM | POA: Diagnosis present

## 2019-11-07 DIAGNOSIS — G8929 Other chronic pain: Secondary | ICD-10-CM | POA: Diagnosis present

## 2019-11-07 DIAGNOSIS — Z79899 Other long term (current) drug therapy: Secondary | ICD-10-CM

## 2019-11-07 DIAGNOSIS — E119 Type 2 diabetes mellitus without complications: Secondary | ICD-10-CM | POA: Diagnosis present

## 2019-11-07 DIAGNOSIS — Z8546 Personal history of malignant neoplasm of prostate: Secondary | ICD-10-CM

## 2019-11-07 DIAGNOSIS — Z825 Family history of asthma and other chronic lower respiratory diseases: Secondary | ICD-10-CM

## 2019-11-07 DIAGNOSIS — Z9079 Acquired absence of other genital organ(s): Secondary | ICD-10-CM

## 2019-11-07 DIAGNOSIS — M1120 Other chondrocalcinosis, unspecified site: Secondary | ICD-10-CM | POA: Diagnosis present

## 2019-11-07 DIAGNOSIS — C4922 Malignant neoplasm of connective and soft tissue of left lower limb, including hip: Principal | ICD-10-CM | POA: Diagnosis present

## 2019-11-07 LAB — CBG MONITORING, ED: Glucose-Capillary: 110 mg/dL — ABNORMAL HIGH (ref 70–99)

## 2019-11-07 MED ORDER — HYDROMORPHONE HCL 1 MG/ML IJ SOLN
1.0000 mg | Freq: Once | INTRAMUSCULAR | Status: AC
  Administered 2019-11-07: 1 mg via INTRAVENOUS
  Filled 2019-11-07: qty 1

## 2019-11-07 MED ORDER — SODIUM CHLORIDE (PF) 0.9 % IJ SOLN
INTRAMUSCULAR | Status: AC
  Filled 2019-11-07: qty 50

## 2019-11-07 MED ORDER — IOHEXOL 300 MG/ML  SOLN
100.0000 mL | Freq: Once | INTRAMUSCULAR | Status: AC | PRN
  Administered 2019-11-07: 100 mL via INTRAVENOUS

## 2019-11-07 MED ORDER — KETOROLAC TROMETHAMINE 30 MG/ML IJ SOLN
30.0000 mg | Freq: Once | INTRAMUSCULAR | Status: AC
  Administered 2019-11-07: 30 mg via INTRAMUSCULAR
  Filled 2019-11-07: qty 1

## 2019-11-07 NOTE — ED Notes (Signed)
EDP at bedside  

## 2019-11-07 NOTE — ED Provider Notes (Signed)
[facility] DEPT Provider Note   CSN: [id number] Arrival date & time: 11/07/19  1609     History Chief Complaint  Patient presents with   Leg Pain    Joshua Pittman is a 79 y.o. male. Patient speaks Spanish primarily and was translated using translating device. HPI Patient presents with left hip pain going down to the knee.  He has had for months now.  Has been seen both in the ER and by his PCP.  Has been on Mobic and Ultram.  Also seen and started on Dilaudid.  The prescription for the Dilaudid was 11 days ago and still has 8 of the 10 pills.  States pain is worse with movements.  No fevers.  States there is a triangular pain in his left groin.  No nausea or vomiting.  Previous history of prostate cancer.  PSA drawn last visit was normal.  Patient does not appear to have filled his prednisone.  Has not followed with orthopedic surgery either.    Past Medical History:  Diagnosis Date   Arrhythmia    Prostate cancer ([facility]) 02/18/14   gleason 8    Patient Active Problem List   Diagnosis Date Noted   Prostate cancer ([place]) 04/14/2014   Malignant neoplasm of prostate ([facility]) 03/27/2014    Past Surgical History:  Procedure Laterality Date   INGUINAL HERNIA REPAIR     x 2   LYMPHADENECTOMY Bilateral 04/14/2014   Procedure: LYMPHADENECTOMY;  Surgeon: [name], MD;  Location: [facility];  Service: Urology;  Laterality: Bilateral;   PROSTATE BIOPSY  02/18/14   gleason 8, vol 50 cc   ROBOT ASSISTED LAPAROSCOPIC RADICAL PROSTATECTOMY N/A 04/14/2014   Procedure: ROBOTIC ASSISTED LAPAROSCOPIC RADICAL PROSTATECTOMY LEVEL 3;  Surgeon: [name], MD;  Location: [facility];  Service: Urology;  Laterality: N/A;       Family History  Problem Relation Age of Onset   Asthma Mother    Cancer Neg Hx     Social History   Tobacco Use   Smoking status: Current Every Day Smoker    Packs/day: 0.25    Years: 40.00    Pack years: 10.00    Types:  Cigarettes   Smokeless tobacco: Never Used   Tobacco comment: 3-4 cigs daily  Substance Use Topics   Alcohol use: Yes    Comment: beer socially on the weekends   Drug use: No    Home Medications Prior to Admission medications   Medication Sig Start Date End Date Taking? Authorizing Provider  HYDROmorphone (DILAUDID) 2 MG tablet Take 0.5 tablets (1 mg total) by mouth 3 (three) times daily as needed for severe pain. 10/27/19  Yes Carmin Muskrat, MD  meloxicam (MOBIC) 7.5 MG tablet Take 1 tablet daily for hip pain. 10/10/19  Yes Molpus, John, MD  traMADol (ULTRAM) 50 MG tablet Take 50 mg by mouth 3 (three) times daily as needed for moderate pain or severe pain.  10/09/19  Yes [provider]  HYDROcodone-acetaminophen (NORCO) 5-325 MG tablet Take 0.5-1 tablets by mouth every 6 (six) hours as needed for severe pain. Patient not taking: Reported on 11/07/2019 10/10/19   Molpus, Jenny Reichmann, MD  predniSONE (DELTASONE) 20 MG tablet Take 2 tablets (40 mg total) by mouth daily with breakfast. For the next four days Patient not taking: Reported on 11/07/2019 10/27/19   Carmin Muskrat, MD    Allergies    Patient has no known allergies.  Review of Systems   Review of Systems  Constitutional: Negative for appetite change.  Respiratory: Negative for shortness of breath.   Cardiovascular: Negative for chest pain.  Genitourinary:       Left groin/hip pain.  Goes down to me.  Musculoskeletal: Negative for back pain.  Skin: Negative for rash and wound.  Neurological: Negative for weakness and numbness.  Psychiatric/Behavioral: Negative for confusion.    Physical Exam Updated Vital Signs BP (!) 200/98    Pulse (!) 38    Temp 98.2 F (36.8 C) (Oral)    Resp 17    SpO2 96%   Physical Exam Vitals reviewed.  HENT:     Head: Normocephalic.  Eyes:     Extraocular Movements: Extraocular movements intact.  Cardiovascular:     Rate and Rhythm: Regular rhythm.  Abdominal:     Comments: Left  inguinal tenderness.  No mass felt.  Tenderness also down left side.  No rash.  Musculoskeletal:     Cervical back: Neck supple.     Comments: Left hip held mostly flexed.  Can move but with pain.  Also pain in left knee.  Tenderness left groin without deformity.  No rash.  Skin:    General: Skin is warm.     Capillary Refill: Capillary refill takes less than 2 seconds.  Neurological:     Mental Status: He is alert. Mental status is at baseline.     ED Results / Procedures / Treatments   Labs (all labs ordered are listed, but only abnormal results are displayed) Labs Reviewed  CBG MONITORING, ED - Abnormal; Notable for the following components:      Result Value   Glucose-Capillary 110 (*)    All other components within normal limits  PROTIME-INR  CBC WITH DIFFERENTIAL/PLATELET  BASIC METABOLIC PANEL    EKG None  Radiology CT ABDOMEN PELVIS W CONTRAST  Result Date: 11/07/2019 CLINICAL DATA:  Right abdominal and hip pain, stage IV prostate cancer EXAM: CT ABDOMEN AND PELVIS WITH CONTRAST TECHNIQUE: Multidetector CT imaging of the abdomen and pelvis was performed using the standard protocol following bolus administration of intravenous contrast. CONTRAST:  155mL OMNIPAQUE IOHEXOL 300 MG/ML  SOLN COMPARISON:  CT 07/26/2017 FINDINGS: Lower chest: Lung bases demonstrate no acute consolidation or pleural effusion. Hepatobiliary: No focal liver abnormality is seen. No gallstones, gallbladder wall thickening, or biliary dilatation. Pancreas: Unremarkable. No pancreatic ductal dilatation or surrounding inflammatory changes. Spleen: Normal in size without focal abnormality. Adrenals/Urinary Tract: Adrenal glands are normal. Kidneys show no hydronephrosis. The urinary bladder is unremarkable Stomach/Bowel: Stomach is nonenlarged. No dilated small bowel. No bowel wall thickening. Negative appendix. Vascular/Lymphatic: Moderate aortic atherosclerosis without aneurysm. No suspicious adenopathy.  Reproductive: Status post prostatectomy. Other: Negative for pelvic effusion or free air. Musculoskeletal: Large heterogeneous complex cystic mass within the left iliacus and iliopsoas muscle. There is mass effect on and displacement of the left psoas major muscle. The mass measures approximately 10.8 cm transverse by 8.2 cm AP by 13.2 cm craniocaudad. Mass appears to contain internal thick septations and possible soft tissue components. There is no underlying bony destructive change. Lucency and sclerosis of the right pelvis with slight bony expansion suggesting pagetoid changes. 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 No fracture is seen. IMPRESSION: 1. Large heterogenous complex cystic mass within the left iliacus and iliopsoas muscle. Differential considerations include organizing/chronic hematoma, neoplasm, and abscess, though not much surrounding inflammatory change. Aspiration/tissue sampling may be considered for further evaluation. 2. 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 No fracture is seen Electronically Signed   By: Donavan Foil M.D.   On: 11/07/2019 23:23   DG Knee Complete 4 Views Left  Result Date: 11/07/2019 CLINICAL DATA:  Pain.  No trauma history submitted. EXAM: LEFT KNEE - COMPLETE 4+ VIEW COMPARISON:  None. FINDINGS: No acute fracture or dislocation. No joint effusion. Moderate medial compartment joint space narrowing and osteophyte formation. Enthesophytes at the quadriceps insertion and patellar tendon origin. Subtle chondrocalcinosis within the menisci. IMPRESSION: Degenerative change, without acute osseous finding. Chondrocalcinosis within the menisci, consistent with calcium pyrophosphate deposition disease. Electronically Signed   By: Abigail Miyamoto M.D.   On: 11/07/2019 19:21    Procedures Procedures (including critical care  time)  Medications Ordered in ED Medications  ketorolac (TORADOL) 30 MG/ML injection 30 mg (30 mg Intramuscular Given 11/07/19 2025)  sodium chloride (PF) 0.9 % injection (  Given 11/07/19 2245)  iohexol (OMNIPAQUE) 300 MG/ML solution 100 mL (100 mLs Intravenous Contrast Given 11/07/19 2227)  HYDROmorphone (DILAUDID) injection 1 mg (1 mg Intravenous Given 11/07/19 2245)    ED Course  I have reviewed the triage vital signs and the nursing notes.  Pertinent labs & imaging results that were available during my care of the patient were reviewed by me and considered in my medical decision making (see chart for details).    MDM Rules/Calculators/A&P                      Patient presents with intractable pain in left hip/groin.  Has been seen for same 11 days ago with reassuring lab work that time.  However continued worsening pain and unrelieved with oral Dilaudid.  CT scan done due to inguinal pain and does show a iliopsoas and iliac [place] muscle cystic mass.  Could be hematoma or neoplasm or abscess.  Abscess felt less likely.  Continued pain unrelieved with home pain medicines.  Feels the patient would benefit from admission to the hospital for pain control and tissue sampling. Final Clinical Impression(s) / ED Diagnoses Final diagnoses:  Mass of iliopsoas muscle group    Rx / DC Orders ED Discharge Orders    None       Davonna Belling, MD 11/07/19 2348

## 2019-11-07 NOTE — ED Notes (Signed)
Pt c/o 10/10 pain while in CT scan medicated PER order

## 2019-11-07 NOTE — ED Triage Notes (Signed)
Spanish speaking patient. Used Spanish interpreter via Elonda Husky [phone] Has bursitis on bilat hip (worse on left) and knee. Pain very bad since yesterday. 3rd time here for same thing and taking medications that was given by doctors and not helping.

## 2019-11-08 ENCOUNTER — Encounter (HOSPITAL_COMMUNITY): Payer: Self-pay | Admitting: Family Medicine

## 2019-11-08 ENCOUNTER — Observation Stay (HOSPITAL_COMMUNITY): Payer: Medicaid Other

## 2019-11-08 DIAGNOSIS — M6289 Other specified disorders of muscle: Secondary | ICD-10-CM | POA: Diagnosis present

## 2019-11-08 DIAGNOSIS — F17210 Nicotine dependence, cigarettes, uncomplicated: Secondary | ICD-10-CM | POA: Diagnosis present

## 2019-11-08 DIAGNOSIS — M1611 Unilateral primary osteoarthritis, right hip: Secondary | ICD-10-CM | POA: Diagnosis present

## 2019-11-08 DIAGNOSIS — C4922 Malignant neoplasm of connective and soft tissue of left lower limb, including hip: Secondary | ICD-10-CM | POA: Diagnosis present

## 2019-11-08 DIAGNOSIS — Z825 Family history of asthma and other chronic lower respiratory diseases: Secondary | ICD-10-CM | POA: Diagnosis not present

## 2019-11-08 DIAGNOSIS — I160 Hypertensive urgency: Secondary | ICD-10-CM | POA: Diagnosis present

## 2019-11-08 DIAGNOSIS — M47816 Spondylosis without myelopathy or radiculopathy, lumbar region: Secondary | ICD-10-CM | POA: Diagnosis present

## 2019-11-08 DIAGNOSIS — Z9079 Acquired absence of other genital organ(s): Secondary | ICD-10-CM | POA: Diagnosis not present

## 2019-11-08 DIAGNOSIS — Z79899 Other long term (current) drug therapy: Secondary | ICD-10-CM | POA: Diagnosis not present

## 2019-11-08 DIAGNOSIS — R52 Pain, unspecified: Secondary | ICD-10-CM | POA: Diagnosis present

## 2019-11-08 DIAGNOSIS — M8580 Other specified disorders of bone density and structure, unspecified site: Secondary | ICD-10-CM | POA: Diagnosis present

## 2019-11-08 DIAGNOSIS — E119 Type 2 diabetes mellitus without complications: Secondary | ICD-10-CM | POA: Diagnosis present

## 2019-11-08 DIAGNOSIS — Z79891 Long term (current) use of opiate analgesic: Secondary | ICD-10-CM | POA: Diagnosis not present

## 2019-11-08 DIAGNOSIS — Z923 Personal history of irradiation: Secondary | ICD-10-CM | POA: Diagnosis not present

## 2019-11-08 DIAGNOSIS — M1120 Other chondrocalcinosis, unspecified site: Secondary | ICD-10-CM | POA: Diagnosis present

## 2019-11-08 DIAGNOSIS — M79652 Pain in left thigh: Secondary | ICD-10-CM | POA: Diagnosis present

## 2019-11-08 DIAGNOSIS — Z20822 Contact with and (suspected) exposure to covid-19: Secondary | ICD-10-CM | POA: Diagnosis present

## 2019-11-08 DIAGNOSIS — Z8546 Personal history of malignant neoplasm of prostate: Secondary | ICD-10-CM | POA: Diagnosis not present

## 2019-11-08 DIAGNOSIS — Z791 Long term (current) use of non-steroidal anti-inflammatories (NSAID): Secondary | ICD-10-CM | POA: Diagnosis not present

## 2019-11-08 DIAGNOSIS — G8929 Other chronic pain: Secondary | ICD-10-CM | POA: Diagnosis present

## 2019-11-08 LAB — SARS CORONAVIRUS 2 BY RT PCR (HOSPITAL ORDER, PERFORMED IN ~~LOC~~ HOSPITAL LAB): SARS Coronavirus 2: NEGATIVE

## 2019-11-08 LAB — BASIC METABOLIC PANEL
Anion gap: 10 (ref 5–15)
BUN: 15 mg/dL (ref 8–23)
CO2: 26 mmol/L (ref 22–32)
Calcium: 9.3 mg/dL (ref 8.9–10.3)
Chloride: 97 mmol/L — ABNORMAL LOW (ref 98–111)
Creatinine, Ser: 0.76 mg/dL (ref 0.61–1.24)
GFR calc Af Amer: >60 mL/min (ref >60)
GFR calc non Af Amer: >60 mL/min (ref >60)
Glucose, Bld: 147 mg/dL — ABNORMAL HIGH (ref 70–99)
Potassium: 4.3 mmol/L (ref 3.5–5.1)
Sodium: 133 mmol/L — ABNORMAL LOW (ref 135–145)

## 2019-11-08 LAB — CBC WITH DIFFERENTIAL/PLATELET
Abs Immature Granulocytes: 0.04 10*3/uL (ref 0.00–0.07)
Basophils Absolute: 0 10*3/uL (ref 0.0–0.1)
Basophils Relative: 0 %
Eosinophils Absolute: 0.1 10*3/uL (ref 0.0–0.5)
Eosinophils Relative: 1 %
HCT: 39.5 % (ref 39.0–52.0)
Hemoglobin: 13.3 g/dL (ref 13.0–17.0)
Immature Granulocytes: 1 %
Lymphocytes Relative: 11 %
Lymphs Abs: 1 10*3/uL (ref 0.7–4.0)
MCH: 31.6 pg (ref 26.0–34.0)
MCHC: 33.7 g/dL (ref 30.0–36.0)
MCV: 93.8 fL (ref 80.0–100.0)
Monocytes Absolute: 0.8 10*3/uL (ref 0.1–1.0)
Monocytes Relative: 10 %
Neutro Abs: 6.9 10*3/uL (ref 1.7–7.7)
Neutrophils Relative %: 77 %
Platelets: 360 10*3/uL (ref 150–400)
RBC: 4.21 MIL/uL — ABNORMAL LOW (ref 4.22–5.81)
RDW: 11.8 % (ref 11.5–15.5)
WBC: 8.8 10*3/uL (ref 4.0–10.5)
nRBC: 0 % (ref 0.0–0.2)

## 2019-11-08 LAB — HEMOGLOBIN A1C
Hgb A1c MFr Bld: 7 % — ABNORMAL HIGH (ref 4.8–5.6)
Mean Plasma Glucose: 154.2 mg/dL

## 2019-11-08 LAB — PROTIME-INR
INR: 1.1 (ref 0.8–1.2)
Prothrombin Time: 13.8 seconds (ref 11.4–15.2)

## 2019-11-08 MED ORDER — FENTANYL CITRATE (PF) 100 MCG/2ML IJ SOLN
INTRAMUSCULAR | Status: AC
  Filled 2019-11-08: qty 4

## 2019-11-08 MED ORDER — ACETAMINOPHEN 325 MG PO TABS
650.0000 mg | ORAL_TABLET | Freq: Four times a day (QID) | ORAL | Status: DC | PRN
  Administered 2019-11-09 (×2): 650 mg via ORAL
  Filled 2019-11-08 (×2): qty 2

## 2019-11-08 MED ORDER — ONDANSETRON HCL 4 MG/2ML IJ SOLN: 4.0000 mg | Freq: Four times a day (QID) | INTRAMUSCULAR | Status: DC | PRN

## 2019-11-08 MED ORDER — SODIUM CHLORIDE 0.9% FLUSH
3.0000 mL | Freq: Two times a day (BID) | INTRAVENOUS | Status: DC
  Administered 2019-11-08 – 2019-11-09 (×2): 3 mL via INTRAVENOUS

## 2019-11-08 MED ORDER — SENNOSIDES-DOCUSATE SODIUM 8.6-50 MG PO TABS: 1.0000 | ORAL_TABLET | Freq: Every evening | ORAL | Status: DC | PRN

## 2019-11-08 MED ORDER — MIDAZOLAM HCL 2 MG/2ML IJ SOLN
INTRAMUSCULAR | Status: AC | PRN
  Administered 2019-11-08 (×2): 1 mg via INTRAVENOUS
  Administered 2019-11-08: 2 mg via INTRAVENOUS

## 2019-11-08 MED ORDER — HYDRALAZINE HCL 25 MG PO TABS
25.0000 mg | ORAL_TABLET | Freq: Four times a day (QID) | ORAL | Status: DC | PRN
  Administered 2019-11-08: 25 mg via ORAL
  Filled 2019-11-08: qty 1

## 2019-11-08 MED ORDER — KETOROLAC TROMETHAMINE 15 MG/ML IJ SOLN
15.0000 mg | Freq: Three times a day (TID) | INTRAMUSCULAR | Status: DC
  Administered 2019-11-08 – 2019-11-09 (×5): 15 mg via INTRAVENOUS
  Filled 2019-11-08 (×5): qty 1

## 2019-11-08 MED ORDER — SODIUM CHLORIDE 0.9 % IV SOLN: INTRAVENOUS | Status: DC

## 2019-11-08 MED ORDER — HYDROMORPHONE HCL 1 MG/ML IJ SOLN
0.5000 mg | INTRAMUSCULAR | Status: DC | PRN
  Administered 2019-11-08 – 2019-11-09 (×6): 1 mg via INTRAVENOUS
  Filled 2019-11-08 (×6): qty 1

## 2019-11-08 MED ORDER — LIDOCAINE HCL (PF) 1 % IJ SOLN
INTRAMUSCULAR | Status: AC | PRN
  Administered 2019-11-08: 5 mL

## 2019-11-08 MED ORDER — LABETALOL HCL 5 MG/ML IV SOLN
10.0000 mg | INTRAVENOUS | Status: DC | PRN
  Administered 2019-11-09: 10 mg via INTRAVENOUS
  Filled 2019-11-08: qty 4

## 2019-11-08 MED ORDER — SODIUM CHLORIDE 0.9 % IV SOLN: 250.0000 mL | INTRAVENOUS | Status: DC | PRN

## 2019-11-08 MED ORDER — FENTANYL CITRATE (PF) 100 MCG/2ML IJ SOLN
INTRAMUSCULAR | Status: AC | PRN
  Administered 2019-11-08 (×2): 50 ug via INTRAVENOUS

## 2019-11-08 MED ORDER — PANTOPRAZOLE SODIUM 40 MG PO TBEC
40.0000 mg | DELAYED_RELEASE_TABLET | Freq: Every day | ORAL | Status: DC
  Administered 2019-11-08 – 2019-11-09 (×2): 40 mg via ORAL
  Filled 2019-11-08 (×2): qty 1

## 2019-11-08 MED ORDER — MIDAZOLAM HCL 2 MG/2ML IJ SOLN
INTRAMUSCULAR | Status: AC
  Filled 2019-11-08: qty 4

## 2019-11-08 MED ORDER — SODIUM CHLORIDE 0.9% FLUSH: 3.0000 mL | INTRAVENOUS | Status: DC | PRN

## 2019-11-08 MED ORDER — AMLODIPINE BESYLATE 10 MG PO TABS
10.0000 mg | ORAL_TABLET | Freq: Every day | ORAL | Status: DC
  Administered 2019-11-08 – 2019-11-09 (×2): 10 mg via ORAL
  Filled 2019-11-08 (×2): qty 1

## 2019-11-08 NOTE — Progress Notes (Signed)
Patient returned from IR procedure. Dressing to left hip is clean dry and intact. Interpreter assisted. Pt remains A&Ox4, drowsy easy to arouse. Pt reminded to call for assistance when needing to get out of bed. Will continue to monitor.

## 2019-11-08 NOTE — Procedures (Signed)
Interventional Radiology Procedure Note  Procedure: CT bx left RP mass   Complications: None  Estimated Blood Loss: min  Findings: Asp for cyto and cx and core bx

## 2019-11-08 NOTE — H&P (Addendum)
History and Physical    Joshua Pittman [id number] DOB: January 21, 1941 DOA: 11/07/2019  PCP: Joshua Hess., MD   Patient coming from: Home   Chief Complaint: Left leg pain   HPI: Joshua Pittman is a 79 y.o. male with medical history significant for prostate cancer status-post radiation now presenting with severe left leg pain. He has chronic right hip pain related to a remote injury but then developed left hip pain 1.5 months ago. Pain radiates from lateral left hip and groin down to the left knee and is associated with some numbness. He denies preceding trauma. Denies fevers or chills, denies weight-loss, but reports occasional night sweats. He was seen in ED a month ago, suspected to have bursitis, was given prescription for meloxicam and Norco, and referred to orthopedics. He did not follow-up with ortho but returned to ED earlier this month with progressive pain and was given oral Dilaudid. Pain continues to worsen, has kept him from sleeping.   ED Course: Upon arrival to the ED, patient is found to be afebrile, saturating well on rm air, and hypertensive to 200/100. CT is concerning for cystic mass involving left iliacus and iliopsoas muscles. He was provided analgesia in ED, continues to have pain, and hospitalists asked to admit.   Review of Systems:  All other systems reviewed and apart from HPI, are negative.  Past Medical History:  Diagnosis Date  . Arrhythmia   . Prostate cancer ([place]) 02/18/14   gleason 8    Past Surgical History:  Procedure Laterality Date  . INGUINAL HERNIA REPAIR     x 2  . LYMPHADENECTOMY Bilateral 04/14/2014   Procedure: LYMPHADENECTOMY;  Surgeon: Joshua Bring, MD;  Location: [facility];  Service: Urology;  Laterality: Bilateral;  . PROSTATE BIOPSY  02/18/14   gleason 8, vol 50 cc  . ROBOT ASSISTED LAPAROSCOPIC RADICAL PROSTATECTOMY N/A 04/14/2014   Procedure: ROBOTIC ASSISTED LAPAROSCOPIC RADICAL PROSTATECTOMY LEVEL 3;  Surgeon: Joshua Bring, MD;  Location: [facility];  Service: Urology;  Laterality: N/A;     reports that he has been smoking cigarettes. He has a 10.00 pack-year smoking history. He has never used smokeless tobacco. He reports current alcohol use. He reports that he does not use drugs.  No Known Allergies  Family History  Problem Relation Age of Onset  . Asthma Mother   . Cancer Neg Hx      Prior to Admission medications   Medication Sig Start Date End Date Taking? Authorizing Provider  HYDROmorphone (DILAUDID) 2 MG tablet Take 0.5 tablets (1 mg total) by mouth 3 (three) times daily as needed for severe pain. 10/27/19  Yes Carmin Muskrat, MD  meloxicam (MOBIC) 7.5 MG tablet Take 1 tablet daily for hip pain. 10/10/19  Yes Molpus, John, MD  traMADol (ULTRAM) 50 MG tablet Take 50 mg by mouth 3 (three) times daily as needed for moderate pain or severe pain.  10/09/19  Yes [provider]  HYDROcodone-acetaminophen (NORCO) 5-325 MG tablet Take 0.5-1 tablets by mouth every 6 (six) hours as needed for severe pain. Patient not taking: Reported on 11/07/2019 10/10/19   Molpus, Jenny Reichmann, MD  predniSONE (DELTASONE) 20 MG tablet Take 2 tablets (40 mg total) by mouth daily with breakfast. For the next four days Patient not taking: Reported on 11/07/2019 10/27/19   Carmin Muskrat, MD    Physical Exam: Vitals:   11/07/19 1626 11/07/19 1931 11/07/19 2308 11/07/19 2315  BP: (!) 160/77 (!) 185/93 (!) 198/79 (!) 200/98  Pulse: 80  66 74 (!) 38  Resp: 17 17 17    Temp: 98.2 F (36.8 C)     TempSrc: Oral Oral    SpO2: 100% 98% 97% 96%    Constitutional: NAD, calm  Eyes: PERTLA, lids and conjunctivae normal ENMT: Mucous membranes are moist. Posterior pharynx clear of any exudate or lesions.   Neck: normal, supple, no masses, no thyromegaly Respiratory: no wheezing, no crackles. No accessory muscle use.  Cardiovascular: S1 & S2 heard, regular rate and rhythm. No extremity edema.  Abdomen: No distension, no tenderness,  soft. Bowel sounds active.  Musculoskeletal: no clubbing / cyanosis. Left hip ROM limited by pain.   Skin: no significant rashes, lesions, ulcers. Warm, dry, well-perfused. Neurologic: CN 2-12 grossly intact. Sensation intact. Strength 5/5 in all 4 limbs.  Psychiatric: Alert and oriented to person, place, and situation. Pleasant and cooperative.    Labs and Imaging on Admission: I have personally reviewed following labs and imaging studies  CBC: Recent Labs  Lab 11/07/19 2355  WBC 8.8  NEUTROABS 6.9  HGB 13.3  HCT 39.5  MCV 93.8  PLT [id number]   Basic Metabolic Panel: No results for input(s): NA, K, CL, CO2, GLUCOSE, BUN, CREATININE, CALCIUM, MG, PHOS in the last 168 hours. GFR: CrCl cannot be calculated (Unknown ideal weight.). Liver Function Tests: No results for input(s): AST, ALT, ALKPHOS, BILITOT, PROT, ALBUMIN in the last 168 hours. No results for input(s): LIPASE, AMYLASE in the last 168 hours. No results for input(s): AMMONIA in the last 168 hours. Coagulation Profile: Recent Labs  Lab 11/07/19 2355  INR 1.1   Cardiac Enzymes: No results for input(s): CKTOTAL, CKMB, CKMBINDEX, TROPONINI in the last 168 hours. BNP (last 3 results) No results for input(s): PROBNP in the last 8760 hours. HbA1C: No results for input(s): HGBA1C in the last 72 hours. CBG: Recent Labs  Lab 11/07/19 2027  GLUCAP 110*   Lipid Profile: No results for input(s): CHOL, HDL, LDLCALC, TRIG, CHOLHDL, LDLDIRECT in the last 72 hours. Thyroid Function Tests: No results for input(s): TSH, T4TOTAL, FREET4, T3FREE, THYROIDAB in the last 72 hours. Anemia Panel: No results for input(s): VITAMINB12, FOLATE, FERRITIN, TIBC, IRON, RETICCTPCT in the last 72 hours. Urine analysis:    Component Value Date/Time   COLORURINE YELLOW 02/09/2017 [place] 02/09/2017 1155   LABSPEC 1.016 02/09/2017 1155   LABSPEC 1.010 11/21/2014 0918   PHURINE 5.0 02/09/2017 1155   GLUCOSEU NEGATIVE  02/09/2017 1155   GLUCOSEU Negative 11/21/2014 0918   HGBUR NEGATIVE 02/09/2017 1155   BILIRUBINUR NEGATIVE 02/09/2017 1155   BILIRUBINUR Negative 11/21/2014 0918   KETONESUR NEGATIVE 02/09/2017 1155   PROTEINUR NEGATIVE 02/09/2017 1155   UROBILINOGEN 0.2 11/21/2014 0918   NITRITE NEGATIVE 02/09/2017 1155   LEUKOCYTESUR NEGATIVE 02/09/2017 1155   LEUKOCYTESUR Negative 11/21/2014 0918   Sepsis Labs: @LABRCNTIP (procalcitonin:4,lacticidven:4) )No results found for this or any previous visit (from the past 240 hour(s)).   Radiological Exams on Admission: CT ABDOMEN PELVIS W CONTRAST  Result Date: 11/07/2019 CLINICAL DATA:  Right abdominal and hip pain, stage IV prostate cancer EXAM: CT ABDOMEN AND PELVIS WITH CONTRAST TECHNIQUE: Multidetector CT imaging of the abdomen and pelvis was performed using the standard protocol following bolus administration of intravenous contrast. CONTRAST:  160mL OMNIPAQUE IOHEXOL 300 MG/ML  SOLN COMPARISON:  CT 07/26/2017 FINDINGS: Lower chest: Lung bases demonstrate no acute consolidation or pleural effusion. Hepatobiliary: No focal liver abnormality is seen. No gallstones, gallbladder wall thickening, or biliary dilatation. Pancreas: Unremarkable. No  pancreatic ductal dilatation or surrounding inflammatory changes. Spleen: Normal in size without focal abnormality. Adrenals/Urinary Tract: Adrenal glands are normal. Kidneys show no hydronephrosis. The urinary bladder is unremarkable Stomach/Bowel: Stomach is nonenlarged. No dilated small bowel. No bowel wall thickening. Negative appendix. Vascular/Lymphatic: Moderate aortic atherosclerosis without aneurysm. No suspicious adenopathy. Reproductive: Status post prostatectomy. Other: Negative for pelvic effusion or free air. Musculoskeletal: Large heterogeneous complex cystic mass within the left iliacus and iliopsoas muscle. There is mass effect on and displacement of the left psoas major muscle. The mass measures  approximately 10.8 cm transverse by 8.2 cm AP by 13.2 cm craniocaudad. Mass appears to contain internal thick septations and possible soft tissue components. There is no underlying bony destructive change. Lucency and sclerosis of the right pelvis with slight bony expansion suggesting pagetoid changes. 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 No fracture is seen. IMPRESSION: 1. Large heterogenous complex cystic mass within the left iliacus and iliopsoas muscle. Differential considerations include organizing/chronic hematoma, neoplasm, and abscess, though not much surrounding inflammatory change. Aspiration/tissue sampling may be considered for further evaluation. 2. 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 No fracture is seen Electronically Signed   By: Donavan Foil M.D.   On: 11/07/2019 23:23   DG Knee Complete 4 Views Left  Result Date: 11/07/2019 CLINICAL DATA:  Pain.  No trauma history submitted. EXAM: LEFT KNEE - COMPLETE 4+ VIEW COMPARISON:  None. FINDINGS: No acute fracture or dislocation. No joint effusion. Moderate medial compartment joint space narrowing and osteophyte formation. Enthesophytes at the quadriceps insertion and patellar tendon origin. Subtle chondrocalcinosis within the menisci. IMPRESSION: Degenerative change, without acute osseous finding. Chondrocalcinosis within the menisci, consistent with calcium pyrophosphate deposition disease. Electronically Signed   By: Abigail Miyamoto M.D.   On: 11/07/2019 19:21    Assessment/Plan   1. Mass involving left iliacus and iliopsoas musculature; intractable pain  - Presents for ~6 wks of progressive atraumatic left hip pain and is found to have cystic mass involving left iliacus and iliopsoas musculature that could reflect hematoma, infection, or neoplasia  - Continue pain-control, consult with IR for tissue  sample   2. Hypertensive urgency  - BP was 200/100 in ED and improved with pain-control  - Continue pain-control, use hydralazine as needed    DVT prophylaxis: SCDs Code Status: Full  Family Communication: Son updated in ED  Disposition Plan:  Patient is from: Home  Anticipated d/c is to: Home  Anticipated d/c date is: 11/09/19 Patient currently: Pending adequate pain-control and IR consultation for tissue sample Consults called: None  Admission status: Observation     Vianne Bulls, MD [facility] Pager: See [email or web address]  If 7AM-7PM, please contact the daytime attending [email or web address]  11/08/2019, 12:25 AM

## 2019-11-08 NOTE — Progress Notes (Signed)
PROGRESS NOTE    Joshua Pittman  [id number] DOB: 1941-02-21 DOA: 11/07/2019 PCP: Sherald Hess., MD   Brief Narrative: Patient is a 89 male with history of prostate cancer status post radiation who presented with complaints of severe left leg pain.  Patient has chronic right hip pain related to a remote injury.  He reported that the pain radiates from lateral left hip and groin to the left knee and is associated with some numbness.  No history of preceding trauma.  On presentation, he was hypertensive.  CT imaging showed cystic mass involving left iliac is/thigh left psoas muscles.  Started on pain management.  Plan for IR guided biopsy of the cystic mass.  Assessment & Plan:   Principal Problem:   Mass of iliopsoas muscle group Active Problems:   Hypertensive urgency   Intractable pain   Pain on the left lower extremity/left iliacus/iliopsoas mass: Presented with 6 weeks history of atraumatic left hip pain.  Pain radiates from lateral left hip and groin to the left knee.  CT imaging showed cystic mass involving the left iliacus and iliopsoas musculature.  Differential diagnosis could be hematoma/infection/neoplasia. Continue pain management.  IR consulted for tissue sampling.  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  Continue  pain management, supportive care  Hypertensive urgency: Systolic blood pressure was in the range of 200s on presentation.  Continue monitoring the blood pressure.  Continue current antihypertensives.  He does not take antihypertensives at home.  Prostate cancer: Currently in remission.  Status post radiation and surgery 5 years ago.         DVT prophylaxis:SCD Code Status: Full Family Communication: family member present at bed side Status is: Observation  The patient remains OBS appropriate and will d/c before 2 midnights.  Dispo: The patient is from:  Home              Anticipated d/c is to: Home              Anticipated d/c date is: 1 day              Patient currently is not medically stable to d/c.     Consultants: IR  Procedures:None  Antimicrobials:  Anti-infectives (From admission, onward)   None      Subjective: Patient seen and examined the bedside this morning.  Hemodynamically stable.  Blood pressure better today.  He is complaining of pain on his left side but feels better than before.  We used the interpreter through iPad  to communicate with him  Objective: Vitals:   11/08/19 0255 11/08/19 0300 11/08/19 0325 11/08/19 0603  BP: (!) 181/89  (!) 188/79 (!) 184/81  Pulse: 72  75 69  Resp:    18  Temp:  98.8 F (37.1 C) 98.8 F (37.1 C) 97.9 F (36.6 C)  TempSrc:  Oral Oral   SpO2: 98%  100% 100%  Weight:   66.7 kg   Height:   5\' 1"  (1.549 m)     Intake/Output Summary (Last 24 hours) at 11/08/2019 [id number] Last data filed at 11/08/2019 [id number] Gross per 24 hour  Intake 146.77 ml  Output 300 ml  Net -153.23 ml   Filed Weights   11/08/19 0325  Weight: 66.7 kg    Examination:  General exam: Not in distress,average built HEENT:PERRL,Oral mucosa moist, Ear/Nose normal on gross exam Respiratory system: Bilateral equal air entry, normal vesicular breath sounds, no wheezes or  crackles  Cardiovascular system: S1 & S2 heard, RRR. No JVD, murmurs, rubs, gallops or clicks. No pedal edema. Gastrointestinal system: Abdomen is nondistended, soft and nontender. No organomegaly or masses felt. Normal bowel sounds heard. Central nervous system: Alert and oriented. No focal neurological deficits. Extremities: No edema, no clubbing ,no cyanosis, tenderness of the left thigh and left. Left lower extremity slightly edematous than right Skin: No rashes, lesions or ulcers,no icterus ,no pallor   Data Reviewed: I have personally reviewed following labs and imaging studies  CBC: Recent Labs  Lab 11/07/19 2355  WBC 8.8    NEUTROABS 6.9  HGB 13.3  HCT 39.5  MCV 93.8  PLT [id number]   Basic Metabolic Panel: Recent Labs  Lab 11/07/19 2355  NA 133*  K 4.3  CL 97*  CO2 26  GLUCOSE 147*  BUN 15  CREATININE 0.76  CALCIUM 9.3   GFR: Estimated Creatinine Clearance: 62.5 mL/min (by C-G formula based on SCr of 0.76 mg/dL). Liver Function Tests: No results for input(s): AST, ALT, ALKPHOS, BILITOT, PROT, ALBUMIN in the last 168 hours. No results for input(s): LIPASE, AMYLASE in the last 168 hours. No results for input(s): AMMONIA in the last 168 hours. Coagulation Profile: Recent Labs  Lab 11/07/19 2355  INR 1.1   Cardiac Enzymes: No results for input(s): CKTOTAL, CKMB, CKMBINDEX, TROPONINI in the last 168 hours. BNP (last 3 results) No results for input(s): PROBNP in the last 8760 hours. HbA1C: No results for input(s): HGBA1C in the last 72 hours. CBG: Recent Labs  Lab 11/07/19 2027  GLUCAP 110*   Lipid Profile: No results for input(s): CHOL, HDL, LDLCALC, TRIG, CHOLHDL, LDLDIRECT in the last 72 hours. Thyroid Function Tests: No results for input(s): TSH, T4TOTAL, FREET4, T3FREE, THYROIDAB in the last 72 hours. Anemia Panel: No results for input(s): VITAMINB12, FOLATE, FERRITIN, TIBC, IRON, RETICCTPCT in the last 72 hours. Sepsis Labs: No results for input(s): PROCALCITON, LATICACIDVEN in the last 168 hours.  Recent Results (from the past 240 hour(s))  SARS Coronavirus 2 by RT PCR (hospital order, performed in [facility] hospital lab) Nasopharyngeal Nasopharyngeal Swab     Status: None   Collection Time: 11/08/19 12:46 AM   Specimen: Nasopharyngeal Swab  Result Value Ref Range Status   SARS Coronavirus 2 NEGATIVE NEGATIVE Final    Comment: (NOTE) SARS-CoV-2 target nucleic acids are NOT DETECTED. The SARS-CoV-2 RNA is generally detectable in upper and lower respiratory specimens during the acute phase of infection. The lowest concentration of SARS-CoV-2 viral copies this assay can detect  is 250 copies / mL. A negative result does not preclude SARS-CoV-2 infection and should not be used as the sole basis for treatment or other patient management decisions.  A negative result may occur with improper specimen collection / handling, submission of specimen other than nasopharyngeal swab, presence of viral mutation(s) within the areas targeted by this assay, and inadequate number of viral copies (<250 copies / mL). A negative result must be combined with clinical observations, patient history, and epidemiological information. Fact Sheet for Patients:   StrictlyIdeas.no Fact Sheet for Healthcare Providers: BankingDealers.co.za This test is not yet approved or cleared  by the [place] FDA and has been authorized for detection and/or diagnosis of SARS-CoV-2 by FDA under an Emergency Use Authorization (EUA).  This EUA will remain in effect (meaning this test can be used) for the duration of the COVID-19 declaration under Section 564(b)(1) of the Act, 21 U.S.C. section 360bbb-3(b)(1), unless the authorization is terminated  or revoked sooner. Performed at [facility], [place] [address], [place], [place] [address]          Radiology Studies: CT ABDOMEN PELVIS W CONTRAST  Result Date: 11/07/2019 CLINICAL DATA:  Right abdominal and hip pain, stage IV prostate cancer EXAM: CT ABDOMEN AND PELVIS WITH CONTRAST TECHNIQUE: Multidetector CT imaging of the abdomen and pelvis was performed using the standard protocol following bolus administration of intravenous contrast. CONTRAST:  171mL OMNIPAQUE IOHEXOL 300 MG/ML  SOLN COMPARISON:  CT 07/26/2017 FINDINGS: Lower chest: Lung bases demonstrate no acute consolidation or pleural effusion. Hepatobiliary: No focal liver abnormality is seen. No gallstones, gallbladder wall thickening, or biliary dilatation. Pancreas: Unremarkable. No pancreatic ductal dilatation or surrounding  inflammatory changes. Spleen: Normal in size without focal abnormality. Adrenals/Urinary Tract: Adrenal glands are normal. Kidneys show no hydronephrosis. The urinary bladder is unremarkable Stomach/Bowel: Stomach is nonenlarged. No dilated small bowel. No bowel wall thickening. Negative appendix. Vascular/Lymphatic: Moderate aortic atherosclerosis without aneurysm. No suspicious adenopathy. Reproductive: Status post prostatectomy. Other: Negative for pelvic effusion or free air. Musculoskeletal: Large heterogeneous complex cystic mass within the left iliacus and iliopsoas muscle. There is mass effect on and displacement of the left psoas major muscle. The mass measures approximately 10.8 cm transverse by 8.2 cm AP by 13.2 cm craniocaudad. Mass appears to contain internal thick septations and possible soft tissue components. There is no underlying bony destructive change. Lucency and sclerosis of the right pelvis with slight bony expansion suggesting pagetoid changes. 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 No fracture is seen. IMPRESSION: 1. Large heterogenous complex cystic mass within the left iliacus and iliopsoas muscle. Differential considerations include organizing/chronic hematoma, neoplasm, and abscess, though not much surrounding inflammatory change. Aspiration/tissue sampling may be considered for further evaluation. 2. 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 No fracture is seen Electronically Signed   By: Donavan Foil M.D.   On: 11/07/2019 23:23   DG Knee Complete 4 Views Left  Result Date: 11/07/2019 CLINICAL DATA:  Pain.  No trauma history submitted. EXAM: LEFT KNEE - COMPLETE 4+ VIEW COMPARISON:  None. FINDINGS: No acute fracture or dislocation. No joint effusion. Moderate medial compartment joint space narrowing and osteophyte formation. Enthesophytes at the  quadriceps insertion and patellar tendon origin. Subtle chondrocalcinosis within the menisci. IMPRESSION: Degenerative change, without acute osseous finding. Chondrocalcinosis within the menisci, consistent with calcium pyrophosphate deposition disease. Electronically Signed   By: Abigail Miyamoto M.D.   On: 11/07/2019 19:21        Scheduled Meds: . ketorolac  15 mg Intravenous Q8H  . pantoprazole  40 mg Oral Daily  . sodium chloride flush  3 mL Intravenous Q12H  . sodium chloride flush  3 mL Intravenous Q12H   Continuous Infusions: . sodium chloride    . sodium chloride 75 mL/hr at 11/08/19 0348     LOS: 0 days    Time spent: 25 mins,More than 50% of that time was spent in counseling and/or coordination of care.      Shelly Coss, MD [facility] P5/21/2021, 8:12 AM

## 2019-11-08 NOTE — Plan of Care (Signed)

## 2019-11-08 NOTE — Progress Notes (Addendum)
Interpreter used to complete assessment. Patient complained of worsening pain in left hip. Provider contacted to assist. Education provided to patient and son regarding skin care. Pt request to wear depends brief. Pt/son insists that pt continue wearing brief.

## 2019-11-08 NOTE — Progress Notes (Signed)
Chief Complaint: Patient was seen in consultation today for (L)iliacus/iliopsoas cystic mass  Referring Physician(s): Dr. Tawanna Solo  Supervising Physician: Daryll Brod  Patient Status: [facility] - In-pt  History of Present Illness: Joshua Pittman is a 79 y.o. male admitted with left hip and leg pain that he's been having for 4-6 months. His imaging workup in the ER showed large heterogenous complex cystic mass within the left iliacus and iliopsoas muscle.  IR is asked to perform image guided biopsy/drainage PMHx, meds, labs, imaging, allergies reviewed. Feels a little better since admission Has been NPO today as directed. Family at bedside. Video interpreter used for discussion.   Past Medical History:  Diagnosis Date  . Arrhythmia   . Prostate cancer ([place]) 02/18/14   gleason 8    Past Surgical History:  Procedure Laterality Date  . INGUINAL HERNIA REPAIR     x 2  . LYMPHADENECTOMY Bilateral 04/14/2014   Procedure: LYMPHADENECTOMY;  Surgeon: [name], MD;  Location: [facility];  Service: Urology;  Laterality: Bilateral;  . PROSTATE BIOPSY  02/18/14   gleason 8, vol 50 cc  . ROBOT ASSISTED LAPAROSCOPIC RADICAL PROSTATECTOMY N/A 04/14/2014   Procedure: ROBOTIC ASSISTED LAPAROSCOPIC RADICAL PROSTATECTOMY LEVEL 3;  Surgeon: [name], MD;  Location: [facility];  Service: Urology;  Laterality: N/A;    Allergies: Patient has no known allergies.  Medications:  Current Facility-Administered Medications:  .  0.9 %  sodium chloride infusion, 250 mL, Intravenous, PRN, Opyd, Ilene Qua, MD .  acetaminophen (TYLENOL) tablet 650 mg, 650 mg, Oral, Q6H PRN, Opyd, Timothy S, MD .  amLODipine (NORVASC) tablet 10 mg, 10 mg, Oral, Daily, Adhikari, Amrit, MD .  HYDROmorphone (DILAUDID) injection 0.5-1 mg, 0.5-1 mg, Intravenous, Q4H PRN, Opyd, Ilene Qua, MD, 1 mg at 11/08/19 0821 .  ketorolac (TORADOL) 15 MG/ML injection 15 mg, 15 mg, Intravenous, Q8H, Opyd, Timothy S, MD, 15 mg at  11/08/19 0813 .  labetalol (NORMODYNE) injection 10 mg, 10 mg, Intravenous, Q2H PRN, Adhikari, Amrit, MD .  ondansetron (ZOFRAN) injection 4 mg, 4 mg, Intravenous, Q6H PRN, Opyd, Timothy S, MD .  pantoprazole (PROTONIX) EC tablet 40 mg, 40 mg, Oral, Daily, Opyd, Ilene Qua, MD, 40 mg at 11/08/19 [id number] .  senna-docusate (Senokot-S) tablet 1 tablet, 1 tablet, Oral, QHS PRN, Opyd, Timothy S, MD .  sodium chloride flush (NS) 0.9 % injection 3 mL, 3 mL, Intravenous, Q12H, Opyd, Timothy S, MD .  sodium chloride flush (NS) 0.9 % injection 3 mL, 3 mL, Intravenous, Q12H, Opyd, Timothy S, MD .  sodium chloride flush (NS) 0.9 % injection 3 mL, 3 mL, Intravenous, PRN, Opyd, Ilene Qua, MD    Family History  Problem Relation Age of Onset  . Asthma Mother   . Cancer Neg Hx     Social History   Socioeconomic History  . Marital status: Married    Spouse name: Not on file  . Number of children: Not on file  . Years of education: Not on file  . Highest education level: Not on file  Occupational History  . Not on file  Tobacco Use  . Smoking status: Current Every Day Smoker    Packs/day: 0.25    Years: 40.00    Pack years: 10.00    Types: Cigarettes  . Smokeless tobacco: Never Used  . Tobacco comment: 3-4 cigs daily  Substance and Sexual Activity  . Alcohol use: Yes    Comment: beer socially on the weekends  . Drug use: No  .  Sexual activity: Yes  Other Topics Concern  . Not on file  Social History Narrative  . Not on file   Social Determinants of Health   Financial Resource Strain:   . Difficulty of Paying Living Expenses:   Food Insecurity:   . Worried About Charity fundraiser in the Last Year:   . Arboriculturist in the Last Year:   Transportation Needs:   . Film/video editor (Medical):   Marland Kitchen Lack of Transportation (Non-Medical):   Physical Activity:   . Days of Exercise per Week:   . Minutes of Exercise per Session:   Stress:   . Feeling of Stress :   Social Connections:    . Frequency of Communication with Friends and Family:   . Frequency of Social Gatherings with Friends and Family:   . Attends Religious Services:   . Active Member of Clubs or Organizations:   . Attends Archivist Meetings:   Marland Kitchen Marital Status:     Review of Systems: A 12 point ROS discussed and pertinent positives are indicated in the HPI above.  All other systems are negative.  Review of Systems  Vital Signs: BP (!) 182/85 (BP Location: Right Arm)   Pulse (!) 49   Temp 98.1 F (36.7 C) (Oral)   Resp 18   Ht 5\' 1"  (1.549 m)   Wt 66.7 kg   SpO2 100%   BMI 27.78 kg/m   Physical Exam Constitutional:      General: He is not in acute distress.    Appearance: He is not ill-appearing or toxic-appearing.  HENT:     Mouth/Throat:     Mouth: Mucous membranes are moist.     Pharynx: Oropharynx is clear.  Cardiovascular:     Rate and Rhythm: Normal rate and regular rhythm.     Heart sounds: Normal heart sounds.  Pulmonary:     Effort: Pulmonary effort is normal. No respiratory distress.     Breath sounds: Normal breath sounds.  Skin:    General: Skin is warm and dry.  Neurological:     General: No focal deficit present.     Mental Status: He is alert and oriented to person, place, and time.  Psychiatric:        Mood and Affect: Mood normal.        Thought Content: Thought content normal.        Judgment: Judgment normal.       Imaging: CT ABDOMEN PELVIS W CONTRAST  Result Date: 11/07/2019 CLINICAL DATA:  Right abdominal and hip pain, stage IV prostate cancer EXAM: CT ABDOMEN AND PELVIS WITH CONTRAST TECHNIQUE: Multidetector CT imaging of the abdomen and pelvis was performed using the standard protocol following bolus administration of intravenous contrast. CONTRAST:  124mL OMNIPAQUE IOHEXOL 300 MG/ML  SOLN COMPARISON:  CT 07/26/2017 FINDINGS: Lower chest: Lung bases demonstrate no acute consolidation or pleural effusion. Hepatobiliary: No focal liver abnormality  is seen. No gallstones, gallbladder wall thickening, or biliary dilatation. Pancreas: Unremarkable. No pancreatic ductal dilatation or surrounding inflammatory changes. Spleen: Normal in size without focal abnormality. Adrenals/Urinary Tract: Adrenal glands are normal. Kidneys show no hydronephrosis. The urinary bladder is unremarkable Stomach/Bowel: Stomach is nonenlarged. No dilated small bowel. No bowel wall thickening. Negative appendix. Vascular/Lymphatic: Moderate aortic atherosclerosis without aneurysm. No suspicious adenopathy. Reproductive: Status post prostatectomy. Other: Negative for pelvic effusion or free air. Musculoskeletal: Large heterogeneous complex cystic mass within the left iliacus and iliopsoas muscle. There is mass  effect on and displacement of the left psoas major muscle. The mass measures approximately 10.8 cm transverse by 8.2 cm AP by 13.2 cm craniocaudad. Mass appears to contain internal thick septations and possible soft tissue components. There is no underlying bony destructive change. Lucency and sclerosis of the right pelvis with slight bony expansion suggesting pagetoid changes. 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 No fracture is seen. IMPRESSION: 1. Large heterogenous complex cystic mass within the left iliacus and iliopsoas muscle. Differential considerations include organizing/chronic hematoma, neoplasm, and abscess, though not much surrounding inflammatory change. Aspiration/tissue sampling may be considered for further evaluation. 2. 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 No fracture is seen Electronically Signed   By: Donavan Foil M.D.   On: 11/07/2019 23:23   DG Knee Complete 4 Views Left  Result Date: 11/07/2019 CLINICAL DATA:  Pain.  No trauma history submitted. EXAM: LEFT KNEE - COMPLETE 4+ VIEW COMPARISON:  None. FINDINGS: No  acute fracture or dislocation. No joint effusion. Moderate medial compartment joint space narrowing and osteophyte formation. Enthesophytes at the quadriceps insertion and patellar tendon origin. Subtle chondrocalcinosis within the menisci. IMPRESSION: Degenerative change, without acute osseous finding. Chondrocalcinosis within the menisci, consistent with calcium pyrophosphate deposition disease. Electronically Signed   By: Abigail Miyamoto M.D.   On: 11/07/2019 19:21   DG Hip Unilat W or Wo Pelvis 2-3 Views Left  Result Date: 10/10/2019 CLINICAL DATA:  79 year old male with hip pain. History of prostate cancer. No trauma. EXAM: DG HIP (WITH OR WITHOUT PELVIS) 2-3V LEFT COMPARISON:  CT abdomen pelvis dated 07/26/2017. FINDINGS: There is no acute fracture or dislocation. The bones are osteopenic. There is a geographic sclerotic area involving the right pelvic bone and right acetabulum as seen on the prior CT most consistent with changes of Paget's. There is severe osteoarthritic changes of the right hip with joint space narrowing and subcortical cysts and bone-on-bone contact. Degenerative changes of the lower lumbar spine. The soft tissues are unremarkable. Vascular calcifications. IMPRESSION: 1. No acute fracture or dislocation. 2. 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 Electronically Signed   By: Anner Crete M.D.   On: 10/10/2019 23:37    Labs:  CBC: Recent Labs    10/27/19 1606 11/07/19 2355  WBC 8.0 8.8  HGB 13.1 13.3  HCT 39.1 39.5  PLT 326 360    COAGS: Recent Labs    11/07/19 2355  INR 1.1    BMP: Recent Labs    10/27/19 1606 11/07/19 2355  NA 133* 133*  K 4.4 4.3  CL 99 97*  CO2 27 26  GLUCOSE 237* 147*  BUN 17 15  CALCIUM 9.0 9.3  CREATININE 0.81 0.76  GFRNONAA >60 >60  GFRAA >60 >60    LIVER FUNCTION TESTS: Recent Labs    10/27/19 1606  BILITOT 0.6  AST 16  ALT 13  ALKPHOS 85  PROT 6.8  ALBUMIN 3.6    TUMOR MARKERS: No  results for input(s): AFPTM, CEA, CA199, CHROMGRNA in the last 8760 hours.  Assessment and Plan: Complex cystic mass within the left iliacus and iliopsoas muscle. Imaging reviewed, will plan for CT guided biopsy, aspiration, possible drain placement. Discussed with pt and son via interpreter. Risks and benefits of (L)hip cystic mass biopsy/drainage was discussed with the patient and/or patient's family including, but not limited to bleeding, infection, damage to adjacent  structures or low yield requiring additional tests.  All of the questions were answered and there is agreement to proceed.  Consent signed and in chart.    Thank you for this interesting consult.  I greatly enjoyed meeting Traeden Rojas-Piloto and look forward to participating in their care.  A copy of this report was sent to the requesting provider on this date.  Electronically Signed: Ascencion Dike, PA-C 11/08/2019, 10:15 AM   I spent a total of 25 minutes in face to face in clinical consultation, greater than 50% of which was counseling/coordinating care for (L)hip cystic mass biopsy

## 2019-11-08 NOTE — Progress Notes (Signed)
SCDs requested via telephone from portable equipment

## 2019-11-08 NOTE — Progress Notes (Signed)
Patient's admission data obtained via assistance of translation on video tablet with Katherin [phone]

## 2019-11-09 LAB — LIPID PANEL
Cholesterol: 162 mg/dL (ref 0–200)
HDL: 54 mg/dL (ref >40)
LDL Cholesterol: 94 mg/dL (ref 0–99)
Total CHOL/HDL Ratio: 3 RATIO
Triglycerides: 71 mg/dL (ref <150)
VLDL: 14 mg/dL (ref 0–40)

## 2019-11-09 MED ORDER — HYDROCODONE-ACETAMINOPHEN 5-325 MG PO TABS: 1.0000 | ORAL_TABLET | ORAL | 0 refills | Status: DC | PRN

## 2019-11-09 MED ORDER — AMLODIPINE BESYLATE 10 MG PO TABS: 10.0000 mg | ORAL_TABLET | Freq: Every day | ORAL | 1 refills | Status: AC

## 2019-11-09 MED ORDER — LOSARTAN POTASSIUM 25 MG PO TABS: 25.0000 mg | ORAL_TABLET | Freq: Every day | ORAL | 1 refills | Status: AC

## 2019-11-09 MED ORDER — MELOXICAM 7.5 MG PO TABS
7.5000 mg | ORAL_TABLET | Freq: Every day | ORAL | 0 refills | Status: AC | PRN
Start: 2019-11-09 — End: ?

## 2019-11-09 MED ORDER — METFORMIN HCL ER 500 MG PO TB24: 500.0000 mg | ORAL_TABLET | Freq: Every day | ORAL | 1 refills | Status: AC

## 2019-11-09 MED ORDER — LOSARTAN POTASSIUM 25 MG PO TABS
25.0000 mg | ORAL_TABLET | Freq: Every day | ORAL | Status: DC
  Administered 2019-11-09: 25 mg via ORAL
  Filled 2019-11-09: qty 1

## 2019-11-09 NOTE — Discharge Summary (Signed)
Physician Discharge Summary  Joshua Pittman [id number] DOB: [birth year] DOA: 11/07/2019  PCP: Sherald Hess., MD  Admit date: 11/07/2019 Discharge date: 11/09/2019  Admitted From: Home Disposition:  Home  Discharge Condition:Stable CODE STATUS:FULL Diet recommendation: Heart Healthy   Brief/Interim Summary: Patient is a 79 male with history of prostate cancer status post radiation who presented with complaints of severe left leg pain.  Patient has chronic right hip pain related to a remote injury.  He reported that the pain radiates from lateral left hip and groin to the left knee and is associated with some numbness.  No history of preceding trauma.  On presentation, he was hypertensive.  CT imaging showed cystic mass involving left iliac is/thigh left psoas muscles.  Started on pain management.  Underwent  IR guided biopsy of the cystic mass.  Biopsy, cytology is pending.  He was also seen by orthopedics and recommended to follow-up upon biopsy report, no intervention for now.  I have discussed about his information with oncologist Dr. Lorenso Courier who will arrange outpatient follow-up.  He is hemodynamically stable for discharge home today.  Following problems were addressed during his hospitalization:  Pain on the left lower extremity/left iliacus/iliopsoas mass: Presented with 6 weeks history of atraumatic left hip pain.  Pain radiates from lateral left hip and groin to the left knee.  CT imaging showed cystic mass involving the left iliacus and iliopsoas musculature.  Differential diagnosis could be sarcoma/benign tumor.  No findings of any infectious process or abscess on the IR guided drain.  Gram stain is negative Continue pain management. I have discussed about his information with oncologist Dr. Lorenso Courier who will arrange outpatient follow-up. We will also fax his discharge summary to his PCP.  Patient will be called for outpatient follow-up if biopsy/cytology shows  malignancy.  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  Continue  pain management, supportive care  Hypertensive urgency: Systolic blood pressure was in the range of 200s on presentation.   He does not take antihypertensives at home.  Started on amlodipine, losartan  Prostate cancer: Currently in remission.  Status post radiation and surgery 5 years ago.  New onset diabetes mellitus type 2: Hemoglobin A1c of 7.  Started on Metformin.   Discharge Diagnoses:  Principal Problem:   Mass of iliopsoas muscle group Active Problems:   Hypertensive urgency   Intractable pain   Left thigh pain    Discharge Instructions  Discharge Instructions    Diet - low sodium heart healthy   Complete by: As directed    Discharge instructions   Complete by: As directed    1)Please follow-up with your PCP in a week. 2)Take prescribed medications as instructed. 3)You will be called if biopsy report comes out to be abnormal.   Increase activity slowly   Complete by: As directed      Allergies as of 11/09/2019   No Known Allergies     Medication List    STOP taking these medications   HYDROmorphone 2 MG tablet Commonly known as: Dilaudid   predniSONE 20 MG tablet Commonly known as: DELTASONE   traMADol 50 MG tablet Commonly known as: ULTRAM     TAKE these medications   amLODipine 10 MG tablet Commonly known as: NORVASC Take 1 tablet (10 mg total) by mouth daily. Start taking on: Nov 10, 2019   HYDROcodone-acetaminophen 5-325 MG tablet Commonly known as: NORCO/VICODIN Take 1 tablet by mouth every 4 (  four) hours as needed for moderate pain. What changed:   how much to take  when to take this  reasons to take this   losartan 25 MG tablet Commonly known as: COZAAR Take 1 tablet (25 mg total) by mouth daily. Start taking on: Nov 10, 2019   meloxicam 7.5 MG tablet Commonly known as:  Mobic Take 1 tablet (7.5 mg total) by mouth daily as needed for pain. Take 1 tablet daily for hip pain. What changed:   how much to take  how to take this  when to take this  reasons to take this   metFORMIN 500 MG 24 hr tablet Commonly known as: Glucophage XR Take 1 tablet (500 mg total) by mouth daily with breakfast.            Durable Medical Equipment  (From admission, onward)         Start     Ordered   11/09/19 1411  For home use only DME Walker rolling  Once    Question Answer Comment  Walker: With [facility]   Patient needs a walker to treat with the following condition Balance disorder      11/09/19 1410         Follow-up Information    Sherald Hess., MD. Schedule an appointment as soon as possible for a visit in 1 week(s).   Specialty: Family Medicine Contact information: [place] [address] [phone]          No Known Allergies  Consultations:  ortho   Procedures/Studies: CT ABDOMEN PELVIS W CONTRAST  Result Date: 11/07/2019 CLINICAL DATA:  Right abdominal and hip pain, stage IV prostate cancer EXAM: CT ABDOMEN AND PELVIS WITH CONTRAST TECHNIQUE: Multidetector CT imaging of the abdomen and pelvis was performed using the standard protocol following bolus administration of intravenous contrast. CONTRAST:  16mL OMNIPAQUE IOHEXOL 300 MG/ML  SOLN COMPARISON:  CT 07/26/2017 FINDINGS: Lower chest: Lung bases demonstrate no acute consolidation or pleural effusion. Hepatobiliary: No focal liver abnormality is seen. No gallstones, gallbladder wall thickening, or biliary dilatation. Pancreas: Unremarkable. No pancreatic ductal dilatation or surrounding inflammatory changes. Spleen: Normal in size without focal abnormality. Adrenals/Urinary Tract: Adrenal glands are normal. Kidneys show no hydronephrosis. The urinary bladder is unremarkable Stomach/Bowel: Stomach is nonenlarged. No dilated small bowel. No bowel wall thickening.  Negative appendix. Vascular/Lymphatic: Moderate aortic atherosclerosis without aneurysm. No suspicious adenopathy. Reproductive: Status post prostatectomy. Other: Negative for pelvic effusion or free air. Musculoskeletal: Large heterogeneous complex cystic mass within the left iliacus and iliopsoas muscle. There is mass effect on and displacement of the left psoas major muscle. The mass measures approximately 10.8 cm transverse by 8.2 cm AP by 13.2 cm craniocaudad. Mass appears to contain internal thick septations and possible soft tissue components. There is no underlying bony destructive change. Lucency and sclerosis of the right pelvis with slight bony expansion suggesting pagetoid changes. 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 No fracture is seen. IMPRESSION: 1. Large heterogenous complex cystic mass within the left iliacus and iliopsoas muscle. Differential considerations include organizing/chronic hematoma, neoplasm, and abscess, though not much surrounding inflammatory change. Aspiration/tissue sampling may be considered for further evaluation. 2. 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 No fracture is seen Electronically Signed   By: Donavan Foil M.D.   On: 11/07/2019 23:23   CT ASPIRATION  Result Date: 11/08/2019 INDICATION: Left hip and leg pain, left pelvic sidewall complex large partially cystic mass EXAM: CT-GUIDED ASPIRATION AND CORE BIOPSY OF THE LEFT PELVIC SIDEWALL MASS MEDICATIONS: 1% LIDOCAINE LOCAL ANESTHESIA/SEDATION: Moderate (conscious) sedation was employed during this procedure. A total of Versed 4.0 mg and Fentanyl 100 mcg was administered intravenously. Moderate Sedation Time: 10 minutes. The patient's level of consciousness and vital signs were monitored continuously by radiology nursing throughout the procedure under my direct supervision.  FLUOROSCOPY TIME:  Fluoroscopy Time: NONE. COMPLICATIONS: None immediate. PROCEDURE: Informed written consent was obtained from the patient after a thorough discussion of the procedural risks, benefits and alternatives. All questions were addressed. Maximal Sterile Barrier Technique was utilized including caps, mask, sterile gowns, sterile gloves, sterile drape, hand hygiene and skin antiseptic. A timeout was performed prior to the initiation of the procedure. Previous imaging reviewed. Patient positioned supine. Noncontrast localization CT performed. The large left pelvic sidewall complex mass was localized and marked for an anterolateral approach. Under sterile conditions and local anesthesia, distally an 18 gauge needle was advanced to the central aspect of the lesion. Syringe aspiration performed. Small amount of brown serosanguineous fluid obtained. No purulence sample. Sample was sent for cytology and culture. Next, a 60 gauge coaxial biopsy needle was advanced into the more peripheral anterior aspect of the lesion along the solid component. Needle position confirmed with CT. 18 gauge core biopsies obtained. These were placed in formalin. Needle removed. Postprocedure imaging demonstrates no hemorrhage or hematoma. Patient tolerated biopsy well. Samples were intact and non fragmented. IMPRESSION: Successful CT-guided aspiration of the left pelvic sidewall complex partially cystic mass for cytology and culture Successful CT-guided core biopsy of the left pelvic sidewall complex masses well. Sent for pathology. Electronically Signed   By: Jerilynn Mages.  Shick M.D.   On: 11/08/2019 14:54   CT BIOPSY  Result Date: 11/08/2019 INDICATION: Left hip and leg pain, left pelvic sidewall complex large partially cystic mass EXAM: CT-GUIDED ASPIRATION AND CORE BIOPSY OF THE LEFT PELVIC SIDEWALL MASS MEDICATIONS: 1% LIDOCAINE LOCAL ANESTHESIA/SEDATION: Moderate (conscious) sedation was employed during this procedure. A total of  Versed 4.0 mg and Fentanyl 100 mcg was administered intravenously. Moderate Sedation Time: 10 minutes. The patient's level of consciousness and vital signs were monitored continuously by radiology nursing throughout the procedure under my direct supervision. FLUOROSCOPY TIME:  Fluoroscopy Time: NONE. COMPLICATIONS: None immediate. PROCEDURE: Informed written consent was obtained from the patient after a thorough discussion of the procedural risks, benefits and alternatives. All questions were addressed. Maximal Sterile Barrier Technique was utilized including caps, mask, sterile gowns, sterile gloves, sterile drape, hand hygiene and skin antiseptic. A timeout was performed prior to the initiation of the procedure. Previous imaging reviewed. Patient positioned supine. Noncontrast localization CT performed. The large left pelvic sidewall complex mass was localized and marked for an anterolateral approach. Under sterile conditions and local anesthesia, distally an 18 gauge needle was advanced to the central aspect of the lesion. Syringe aspiration performed. Small amount of brown serosanguineous fluid obtained. No purulence sample. Sample was sent for cytology and culture. Next, a 39 gauge coaxial biopsy needle was advanced into the more peripheral anterior aspect of the lesion along the solid component. Needle position confirmed with CT. 18 gauge core biopsies obtained. These were placed in formalin. Needle removed. Postprocedure imaging demonstrates no hemorrhage or hematoma. Patient tolerated biopsy well. Samples were intact and non fragmented. IMPRESSION: Successful CT-guided aspiration of the left pelvic sidewall complex partially cystic mass for cytology  and culture Successful CT-guided core biopsy of the left pelvic sidewall complex masses well. Sent for pathology. Electronically Signed   By: Jerilynn Mages.  Shick M.D.   On: 11/08/2019 14:54   DG Knee Complete 4 Views Left  Result Date: 11/07/2019 CLINICAL DATA:  Pain.   No trauma history submitted. EXAM: LEFT KNEE - COMPLETE 4+ VIEW COMPARISON:  None. FINDINGS: No acute fracture or dislocation. No joint effusion. Moderate medial compartment joint space narrowing and osteophyte formation. Enthesophytes at the quadriceps insertion and patellar tendon origin. Subtle chondrocalcinosis within the menisci. IMPRESSION: Degenerative change, without acute osseous finding. Chondrocalcinosis within the menisci, consistent with calcium pyrophosphate deposition disease. Electronically Signed   By: Abigail Miyamoto M.D.   On: 11/07/2019 19:21   DG Hip Unilat W or Wo Pelvis 2-3 Views Left  Result Date: 10/10/2019 CLINICAL DATA:  79 year old male with hip pain. History of prostate cancer. No trauma. EXAM: DG HIP (WITH OR WITHOUT PELVIS) 2-3V LEFT COMPARISON:  CT abdomen pelvis dated 07/26/2017. FINDINGS: There is no acute fracture or dislocation. The bones are osteopenic. There is a geographic sclerotic area involving the right pelvic bone and right acetabulum as seen on the prior CT most consistent with changes of Paget's. There is severe osteoarthritic changes of the right hip with joint space narrowing and subcortical cysts and bone-on-bone contact. Degenerative changes of the lower lumbar spine. The soft tissues are unremarkable. Vascular calcifications. IMPRESSION: 1. No acute fracture or dislocation. 2. 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 Electronically Signed   By: Anner Crete M.D.   On: 10/10/2019 23:37       Subjective: Patient seen and examined at the bedside this morning.  Hemodynamically stable for discharge to home.  He was seen by physical therapy, no recommendation on follow-up.  Recommended rolling walker on discharge.  Discharge Exam: Vitals:   11/09/19 1028 11/09/19 1316  BP: (!) 157/71 (!) 154/70  Pulse: 71 77  Resp: 20 18  Temp: 98.2 F (36.8 C) 98.7 F (37.1 C)  SpO2: 97% 96%   Vitals:   11/09/19 0652 11/09/19 0936 11/09/19  1028 11/09/19 1316  BP: (!) 156/72 (!) 163/77 (!) 157/71 (!) 154/70  Pulse: 66 79 71 77  Resp: 20 20 20 18   Temp: 98.7 F (37.1 C)  98.2 F (36.8 C) 98.7 F (37.1 C)  TempSrc: Oral  Oral Oral  SpO2: 98% 98% 97% 96%  Weight:      Height:        General: Pt is alert, awake, not in acute distress Cardiovascular: RRR, S1/S2 +, no rubs, no gallops Respiratory: CTA bilaterally, no wheezing, no rhonchi Abdominal: Soft, NT, ND, bowel sounds + Extremities: no edema, no cyanosis    The results of significant diagnostics from this hospitalization (including imaging, microbiology, ancillary and laboratory) are listed below for reference.     Microbiology: Recent Results (from the past 240 hour(s))  SARS Coronavirus 2 by RT PCR (hospital order, performed in [facility] hospital lab) Nasopharyngeal Nasopharyngeal Swab     Status: None   Collection Time: 11/08/19 12:46 AM   Specimen: Nasopharyngeal Swab  Result Value Ref Range Status   SARS Coronavirus 2 NEGATIVE NEGATIVE Final    Comment: (NOTE) SARS-CoV-2 target nucleic acids are NOT DETECTED. The SARS-CoV-2 RNA is generally detectable in upper and lower respiratory specimens during the acute phase of infection. The lowest concentration of SARS-CoV-2 viral copies this assay can detect is 250 copies / mL. A negative result does  not preclude SARS-CoV-2 infection and should not be used as the sole basis for treatment or other patient management decisions.  A negative result may occur with improper specimen collection / handling, submission of specimen other than nasopharyngeal swab, presence of viral mutation(s) within the areas targeted by this assay, and inadequate number of viral copies (<250 copies / mL). A negative result must be combined with clinical observations, patient history, and epidemiological information. Fact Sheet for Patients:   StrictlyIdeas.no Fact Sheet for Healthcare  Providers: BankingDealers.co.za This test is not yet approved or cleared  by the [place] FDA and has been authorized for detection and/or diagnosis of SARS-CoV-2 by FDA under an Emergency Use Authorization (EUA).  This EUA will remain in effect (meaning this test can be used) for the duration of the COVID-19 declaration under Section 564(b)(1) of the Act, 21 U.S.C. section 360bbb-3(b)(1), unless the authorization is terminated or revoked sooner. Performed at [facility], [place] [address] [place], [place] [address]   Aerobic/Anaerobic Culture (surgical/deep wound)     Status: None (Preliminary result)   Collection Time: 11/08/19  1:56 PM   Specimen: Pelvis; Tissue  Result Value Ref Range Status   Specimen Description   Final    PELVIS LEFT Performed at [facility] [address] [place], [place] [address]    Special Requests   Final    Normal Performed at [facility], [place] [address] [place], [place] [address]    Gram Stain NO WBC SEEN NO ORGANISMS SEEN   Final   Culture   Final    NO GROWTH < 24 HOURS Performed at [facility], [place] [address] [place],  [address]    Report Status PENDING  Incomplete     Labs: BNP (last 3 results) No results for input(s): BNP in the last 8760 hours. Basic Metabolic Panel: Recent Labs  Lab 11/07/19 2355  NA 133*  K 4.3  CL 97*  CO2 26  GLUCOSE 147*  BUN 15  CREATININE 0.76  CALCIUM 9.3   Liver Function Tests: No results for input(s): AST, ALT, ALKPHOS, BILITOT, PROT, ALBUMIN in the last 168 hours. No results for input(s): LIPASE, AMYLASE in the last 168 hours. No results for input(s): AMMONIA in the last 168 hours. CBC: Recent Labs  Lab 11/07/19 2355  WBC 8.8  NEUTROABS 6.9  HGB 13.3  HCT 39.5  MCV 93.8  PLT 360   Cardiac Enzymes: No results for input(s): CKTOTAL, CKMB, CKMBINDEX, TROPONINI in the last 168  hours. BNP: Invalid input(s): POCBNP CBG: Recent Labs  Lab 11/07/19 2027  GLUCAP 110*   D-Dimer No results for input(s): DDIMER in the last 72 hours. Hgb A1c Recent Labs    11/07/19 2355  HGBA1C 7.0*   Lipid Profile Recent Labs    11/09/19 0426  CHOL 162  HDL 54  LDLCALC 94  TRIG 71  CHOLHDL 3.0   Thyroid function studies No results for input(s): TSH, T4TOTAL, T3FREE, THYROIDAB in the last 72 hours.  Invalid input(s): FREET3 Anemia work up No results for input(s): VITAMINB12, FOLATE, FERRITIN, TIBC, IRON, RETICCTPCT in the last 72 hours. Urinalysis    Component Value Date/Time   COLORURINE YELLOW 02/09/2017 1155   APPEARANCEUR CLEAR 02/09/2017 1155   LABSPEC 1.016 02/09/2017 1155   LABSPEC 1.010 11/21/2014 0918   PHURINE 5.0 02/09/2017 1155   GLUCOSEU NEGATIVE 02/09/2017 1155   GLUCOSEU Negative 11/21/2014 0918   HGBUR NEGATIVE 02/09/2017 1155  BILIRUBINUR NEGATIVE 02/09/2017 1155   BILIRUBINUR Negative 11/21/2014 0918   KETONESUR NEGATIVE 02/09/2017 1155   PROTEINUR NEGATIVE 02/09/2017 1155   UROBILINOGEN 0.2 11/21/2014 0918   NITRITE NEGATIVE 02/09/2017 1155   LEUKOCYTESUR NEGATIVE 02/09/2017 1155   LEUKOCYTESUR Negative 11/21/2014 0918   Sepsis Labs Invalid input(s): PROCALCITONIN,  WBC,  LACTICIDVEN Microbiology Recent Results (from the past 240 hour(s))  SARS Coronavirus 2 by RT PCR (hospital order, performed in [facility] hospital lab) Nasopharyngeal Nasopharyngeal Swab     Status: None   Collection Time: 11/08/19 12:46 AM   Specimen: Nasopharyngeal Swab  Result Value Ref Range Status   SARS Coronavirus 2 NEGATIVE NEGATIVE Final    Comment: (NOTE) SARS-CoV-2 target nucleic acids are NOT DETECTED. The SARS-CoV-2 RNA is generally detectable in upper and lower respiratory specimens during the acute phase of infection. The lowest concentration of SARS-CoV-2 viral copies this assay can detect is 250 copies / mL. A negative result does not preclude  SARS-CoV-2 infection and should not be used as the sole basis for treatment or other patient management decisions.  A negative result may occur with improper specimen collection / handling, submission of specimen other than nasopharyngeal swab, presence of viral mutation(s) within the areas targeted by this assay, and inadequate number of viral copies (<250 copies / mL). A negative result must be combined with clinical observations, patient history, and epidemiological information. Fact Sheet for Patients:   StrictlyIdeas.no Fact Sheet for Healthcare Providers: BankingDealers.co.za This test is not yet approved or cleared  by the [place] FDA and has been authorized for detection and/or diagnosis of SARS-CoV-2 by FDA under an Emergency Use Authorization (EUA).  This EUA will remain in effect (meaning this test can be used) for the duration of the COVID-19 declaration under Section 564(b)(1) of the Act, 21 U.S.C. section 360bbb-3(b)(1), unless the authorization is terminated or revoked sooner. Performed at [facility], [place] [address], [place], [place] [address]   Aerobic/Anaerobic Culture (surgical/deep wound)     Status: None (Preliminary result)   Collection Time: 11/08/19  1:56 PM   Specimen: Pelvis; Tissue  Result Value Ref Range Status   Specimen Description   Final    PELVIS LEFT Performed at [facility] [address], [place], [place] [address]    Special Requests   Final    Normal Performed at [facility], [place] [address], [place], [place] [address]    Gram Stain NO WBC SEEN NO ORGANISMS SEEN   Final   Culture   Final    NO GROWTH < 24 HOURS Performed at [facility], [place] [address] [place], [place] [address]    Report Status PENDING  Incomplete    Please note: You were cared for by a hospitalist during your hospital stay. Once you are discharged,  your primary care physician will handle any further medical issues. Please note that NO REFILLS for any discharge medications will be authorized once you are discharged, as it is imperative that you return to your primary care physician (or establish a relationship with a primary care physician if you do not have one) for your post hospital discharge needs so that they can reassess your need for medications and monitor your lab values.    Time coordinating discharge: 40 minutes  SIGNED:   Shelly Coss, MD  [facility] 11/09/2019, 2:14 PM Pager [phone]  If 7PM-7AM, please contact night-coverage [email or web address] Password [email or web address]

## 2019-11-09 NOTE — Consult Note (Addendum)
Reason for Consult:left iliacus mass Referring Physician: Shelly Coss MD  Joshua Pittman is an 79 y.o. male.  HPI: 79 year old male with past history of prostate cancer post radiation with normal PSA 10/27/2019 presents with left lower abdominal pelvic pain that radiates into his left thigh sometimes into his left calf.  He has never had any other cancers other than prostate cancer.  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  Previous chondrocalcinosis noted on plain radiographs of his knee joint.  Patient describes primarily radiating pain intermittent severe.  Patient has family at the bedside as well as his wife and interpretive service screen is used for discussion with patient and family.  Patient has a 10.8 x 8.2 x 13.2 mass with some cystic appearance in it.  Patient psoas muscle proximally appears normal.  He has significant degenerative changes in the lumbar spine with endplate sclerosis but no areas at.  Destructive.  Past Medical History:  Diagnosis Date  . Arrhythmia   . Prostate cancer ([place]) 02/18/14   gleason 8    Past Surgical History:  Procedure Laterality Date  . INGUINAL HERNIA REPAIR     x 2  . LYMPHADENECTOMY Bilateral 04/14/2014   Procedure: LYMPHADENECTOMY;  Surgeon: [name], MD;  Location: [facility];  Service: Urology;  Laterality: Bilateral;  . PROSTATE BIOPSY  02/18/14   gleason 8, vol 50 cc  . ROBOT ASSISTED LAPAROSCOPIC RADICAL PROSTATECTOMY N/A 04/14/2014   Procedure: ROBOTIC ASSISTED LAPAROSCOPIC RADICAL PROSTATECTOMY LEVEL 3;  Surgeon: [name], MD;  Location: [facility];  Service: Urology;  Laterality: N/A;    Family History  Problem Relation Age of Onset  . Asthma Mother   . Cancer Neg Hx     Social History:  reports that he has been smoking cigarettes. He has a 10.00 pack-year smoking history. He has never used smokeless tobacco. He reports current alcohol use. He reports that  he does not use drugs.  Allergies: No Known Allergies  Medications: I have reviewed the patient's current medications.  Results for orders placed or performed during the hospital encounter of 11/07/19 (from the past 48 hour(s))  CBG monitoring, ED     Status: Abnormal   Collection Time: 11/07/19  8:27 PM  Result Value Ref Range   Glucose-Capillary 110 (H) 70 - 99 mg/dL    Comment: Glucose reference range applies only to samples taken after fasting for at least 8 hours.  Protime-INR     Status: None   Collection Time: 11/07/19 11:55 PM  Result Value Ref Range   Prothrombin Time 13.8 11.4 - 15.2 seconds   INR 1.1 0.8 - 1.2    Comment: (NOTE) INR goal varies based on device and disease states. Performed at [facility], [place] [address], [place], [place] [address]   CBC with Differential     Status: Abnormal   Collection Time: 11/07/19 11:55 PM  Result Value Ref Range   WBC 8.8 4.0 - 10.5 K/uL   RBC 4.21 (L) 4.22 - 5.81 MIL/uL   Hemoglobin 13.3 13.0 - 17.0 g/dL   HCT 39.5 39.0 - 52.0 %   MCV 93.8 80.0 - 100.0 fL   MCH 31.6 26.0 - 34.0 pg   MCHC 33.7 30.0 - 36.0 g/dL   RDW 11.8 11.5 - 15.5 %   Platelets 360 150 - 400 K/uL   nRBC 0.0 0.0 - 0.2 %   Neutrophils Relative % 77 %  Neutro Abs 6.9 1.7 - 7.7 K/uL   Lymphocytes Relative 11 %   Lymphs Abs 1.0 0.7 - 4.0 K/uL   Monocytes Relative 10 %   Monocytes Absolute 0.8 0.1 - 1.0 K/uL   Eosinophils Relative 1 %   Eosinophils Absolute 0.1 0.0 - 0.5 K/uL   Basophils Relative 0 %   Basophils Absolute 0.0 0.0 - 0.1 K/uL   Immature Granulocytes 1 %   Abs Immature Granulocytes 0.04 0.00 - 0.07 K/uL    Comment: Performed at [facility], [place] [address], [place], [place] [id number]  Basic metabolic panel     Status: Abnormal   Collection Time: 11/07/19 11:55 PM  Result Value Ref Range   Sodium 133 (L) 135 - 145 mmol/L   Potassium 4.3 3.5 - 5.1 mmol/L   Chloride 97 (L) 98 - 111 mmol/L   CO2 26 22  - 32 mmol/L   Glucose, Bld 147 (H) 70 - 99 mg/dL    Comment: Glucose reference range applies only to samples taken after fasting for at least 8 hours.   BUN 15 8 - 23 mg/dL   Creatinine, Ser 0.76 0.61 - 1.24 mg/dL   Calcium 9.3 8.9 - 10.3 mg/dL   GFR calc non Af Amer >60 >60 mL/min   GFR calc Af Amer >60 >60 mL/min   Anion gap 10 5 - 15    Comment: Performed at [facility], [place] [address] [place], [place] [address]  Hemoglobin A1c     Status: Abnormal   Collection Time: 11/07/19 11:55 PM  Result Value Ref Range   Hgb A1c MFr Bld 7.0 (H) 4.8 - 5.6 %    Comment: (NOTE) Pre diabetes:          5.7%-6.4% Diabetes:              >6.4% Glycemic control for   <7.0% adults with diabetes    Mean Plasma Glucose 154.2 mg/dL    Comment: Performed at [facility], [place] [address], [place], [place] [address]  SARS Coronavirus 2 by RT PCR (hospital order, performed in [facility] hospital lab) Nasopharyngeal Nasopharyngeal Swab     Status: None   Collection Time: 11/08/19 12:46 AM   Specimen: Nasopharyngeal Swab  Result Value Ref Range   SARS Coronavirus 2 NEGATIVE NEGATIVE    Comment: (NOTE) SARS-CoV-2 target nucleic acids are NOT DETECTED. The SARS-CoV-2 RNA is generally detectable in upper and lower respiratory specimens during the acute phase of infection. The lowest concentration of SARS-CoV-2 viral copies this assay can detect is 250 copies / mL. A negative result does not preclude SARS-CoV-2 infection and should not be used as the sole basis for treatment or other patient management decisions.  A negative result may occur with improper specimen collection / handling, submission of specimen other than nasopharyngeal swab, presence of viral mutation(s) within the areas targeted by this assay, and inadequate number of viral copies (<250 copies / mL). A negative result must be combined with clinical observations, patient history, and epidemiological  information. Fact Sheet for Patients:   StrictlyIdeas.no Fact Sheet for Healthcare Providers: BankingDealers.co.za This test is not yet approved or cleared  by the [place] FDA and has been authorized for detection and/or diagnosis of SARS-CoV-2 by FDA under an Emergency Use Authorization (EUA).  This EUA will remain in effect (meaning this test can be used) for the duration of the COVID-19 declaration under Section 564(b)(1) of the Act, 21 U.S.C.  section 360bbb-3(b)(1), unless the authorization is terminated or revoked sooner. Performed at [facility], [place] [address] [place], [place] [address]   Aerobic/Anaerobic Culture (surgical/deep wound)     Status: None (Preliminary result)   Collection Time: 11/08/19  1:56 PM   Specimen: Pelvis; Tissue  Result Value Ref Range   Specimen Description      PELVIS LEFT Performed at [facility] [address] [place], [place] [address]    Special Requests      Normal Performed at [facility], [place] [address] [place], [place] [address]    Gram Stain      NO WBC SEEN NO ORGANISMS SEEN Performed at [facility], [place] [address], [place], [place] [address]    Culture PENDING    Report Status PENDING   Lipid panel     Status: None   Collection Time: 11/09/19  4:26 AM  Result Value Ref Range   Cholesterol 162 0 - 200 mg/dL   Triglycerides 71 <150 mg/dL   HDL 54 >40 mg/dL   Total CHOL/HDL Ratio 3.0 RATIO   VLDL 14 0 - 40 mg/dL   LDL Cholesterol 94 0 - 99 mg/dL    Comment:        Total Cholesterol/HDL:CHD Risk Coronary Heart Disease Risk Table                     Men   Women  1/2 Average Risk   3.4   3.3  Average Risk       5.0   4.4  2 X Average Risk   9.6   7.1  3 X Average Risk  23.4   11.0        Use the calculated Patient Ratio above and the CHD Risk Table to determine the patient's CHD Risk.        ATP III  CLASSIFICATION (LDL):  <100     mg/dL   Optimal  100-129  mg/dL   Near or Above                    Optimal  130-159  mg/dL   Borderline  160-189  mg/dL   High  >190     mg/dL   Very High Performed at [facility] [address], [place], [place] [address]     CT ABDOMEN PELVIS W CONTRAST  Result Date: 11/07/2019 CLINICAL DATA:  Right abdominal and hip pain, stage IV prostate cancer EXAM: CT ABDOMEN AND PELVIS WITH CONTRAST TECHNIQUE: Multidetector CT imaging of the abdomen and pelvis was performed using the standard protocol following bolus administration of intravenous contrast. CONTRAST:  136mL OMNIPAQUE IOHEXOL 300 MG/ML  SOLN COMPARISON:  CT 07/26/2017 FINDINGS: Lower chest: Lung bases demonstrate no acute consolidation or pleural effusion. Hepatobiliary: No focal liver abnormality is seen. No gallstones, gallbladder wall thickening, or biliary dilatation. Pancreas: Unremarkable. No pancreatic ductal dilatation or surrounding inflammatory changes. Spleen: Normal in size without focal abnormality. Adrenals/Urinary Tract: Adrenal glands are normal. Kidneys show no hydronephrosis. The urinary bladder is unremarkable Stomach/Bowel: Stomach is nonenlarged. No dilated small bowel. No bowel wall thickening. Negative appendix. Vascular/Lymphatic: Moderate aortic atherosclerosis without aneurysm. No suspicious adenopathy. Reproductive: Status post prostatectomy. Other: Negative for pelvic effusion or free air. Musculoskeletal: Large heterogeneous complex cystic mass within the left iliacus and iliopsoas muscle. There is mass effect on and displacement of the left psoas major muscle. The mass measures approximately  10.8 cm transverse by 8.2 cm AP by 13.2 cm craniocaudad. Mass appears to contain internal thick septations and possible soft tissue components. There is no underlying bony destructive change. Lucency and sclerosis of the right pelvis with slight bony expansion suggesting  pagetoid changes. 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 No fracture is seen. IMPRESSION: 1. Large heterogenous complex cystic mass within the left iliacus and iliopsoas muscle. Differential considerations include organizing/chronic hematoma, neoplasm, and abscess, though not much surrounding inflammatory change. Aspiration/tissue sampling may be considered for further evaluation. 2. 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 No fracture is seen Electronically Signed   By: Donavan Foil M.D.   On: 11/07/2019 23:23   CT ASPIRATION  Result Date: 11/08/2019 INDICATION: Left hip and leg pain, left pelvic sidewall complex large partially cystic mass EXAM: CT-GUIDED ASPIRATION AND CORE BIOPSY OF THE LEFT PELVIC SIDEWALL MASS MEDICATIONS: 1% LIDOCAINE LOCAL ANESTHESIA/SEDATION: Moderate (conscious) sedation was employed during this procedure. A total of Versed 4.0 mg and Fentanyl 100 mcg was administered intravenously. Moderate Sedation Time: 10 minutes. The patient's level of consciousness and vital signs were monitored continuously by radiology nursing throughout the procedure under my direct supervision. FLUOROSCOPY TIME:  Fluoroscopy Time: NONE. COMPLICATIONS: None immediate. PROCEDURE: Informed written consent was obtained from the patient after a thorough discussion of the procedural risks, benefits and alternatives. All questions were addressed. Maximal Sterile Barrier Technique was utilized including caps, mask, sterile gowns, sterile gloves, sterile drape, hand hygiene and skin antiseptic. A timeout was performed prior to the initiation of the procedure. Previous imaging reviewed. Patient positioned supine. Noncontrast localization CT performed. The large left pelvic sidewall complex mass was localized and marked for an anterolateral approach. Under sterile conditions and  local anesthesia, distally an 18 gauge needle was advanced to the central aspect of the lesion. Syringe aspiration performed. Small amount of brown serosanguineous fluid obtained. No purulence sample. Sample was sent for cytology and culture. Next, a 40 gauge coaxial biopsy needle was advanced into the more peripheral anterior aspect of the lesion along the solid component. Needle position confirmed with CT. 18 gauge core biopsies obtained. These were placed in formalin. Needle removed. Postprocedure imaging demonstrates no hemorrhage or hematoma. Patient tolerated biopsy well. Samples were intact and non fragmented. IMPRESSION: Successful CT-guided aspiration of the left pelvic sidewall complex partially cystic mass for cytology and culture Successful CT-guided core biopsy of the left pelvic sidewall complex masses well. Sent for pathology. Electronically Signed   By: Jerilynn Mages.  Shick M.D.   On: 11/08/2019 14:54   CT BIOPSY  Result Date: 11/08/2019 INDICATION: Left hip and leg pain, left pelvic sidewall complex large partially cystic mass EXAM: CT-GUIDED ASPIRATION AND CORE BIOPSY OF THE LEFT PELVIC SIDEWALL MASS MEDICATIONS: 1% LIDOCAINE LOCAL ANESTHESIA/SEDATION: Moderate (conscious) sedation was employed during this procedure. A total of Versed 4.0 mg and Fentanyl 100 mcg was administered intravenously. Moderate Sedation Time: 10 minutes. The patient's level of consciousness and vital signs were monitored continuously by radiology nursing throughout the procedure under my direct supervision. FLUOROSCOPY TIME:  Fluoroscopy Time: NONE. COMPLICATIONS: None immediate. PROCEDURE: Informed written consent was obtained from the patient after a thorough discussion of the procedural risks, benefits and alternatives. All questions were addressed. Maximal Sterile Barrier Technique was utilized including caps, mask, sterile gowns, sterile gloves, sterile drape, hand hygiene and skin antiseptic. A timeout was performed prior to  the initiation of the procedure. Previous imaging reviewed. Patient positioned supine. Noncontrast localization CT performed. The large left pelvic sidewall complex mass was localized and marked for an anterolateral approach. Under sterile conditions and local anesthesia, distally an 18 gauge needle was advanced to the central aspect of the lesion. Syringe aspiration performed. Small amount of brown serosanguineous fluid obtained. No purulence sample. Sample was sent for cytology and culture. Next, a 68 gauge coaxial biopsy needle was advanced into the more peripheral anterior aspect of the lesion along the solid component. Needle position confirmed with CT. 18 gauge core biopsies obtained. These were placed in formalin. Needle removed. Postprocedure imaging demonstrates no hemorrhage or hematoma. Patient tolerated biopsy well. Samples were intact and non fragmented. IMPRESSION: Successful CT-guided aspiration of the left pelvic sidewall complex partially cystic mass for cytology and culture Successful CT-guided core biopsy of the left pelvic sidewall complex masses well. Sent for pathology. Electronically Signed   By: Jerilynn Mages.  Shick M.D.   On: 11/08/2019 14:54   DG Knee Complete 4 Views Left  Result Date: 11/07/2019 CLINICAL DATA:  Pain.  No trauma history submitted. EXAM: LEFT KNEE - COMPLETE 4+ VIEW COMPARISON:  None. FINDINGS: No acute fracture or dislocation. No joint effusion. Moderate medial compartment joint space narrowing and osteophyte formation. Enthesophytes at the quadriceps insertion and patellar tendon origin. Subtle chondrocalcinosis within the menisci. IMPRESSION: Degenerative change, without acute osseous finding. Chondrocalcinosis within the menisci, consistent with calcium pyrophosphate deposition disease. Electronically Signed   By: Abigail Miyamoto M.D.   On: 11/07/2019 19:21    ROS 14 systems negative as pertains to HPI.  Negative for weight loss.  Negative for chills or fever.positive diabetes  with A1C 7.0 Physical Exam  Constitutional:  Thin alert talkative cooperative.  HENT:  Head: Normocephalic.  Eyes: Pupils are equal, round, and reactive to light.  Neck: No tracheal deviation present. No thyromegaly present.  Cardiovascular: Normal rate.  Respiratory: Effort normal. He has no wheezes.  GI: There is abdominal tenderness.  Musculoskeletal:     Cervical back: Normal range of motion.  Neurological: He is alert.  Skin: Skin is warm. No erythema.  Psychiatric: He has a normal mood and affect.    Assessment/Plan: Patient had needle biopsy and results are pending.  No notation about fluid collection and aspiration tissue listed as intact and nonfragmented.  Differential diagnosis includes neoplasm such as a sarcoma, possible benign lesion.  Typically referral to tertiary referral center university setting if there is is a malignant lesion is recommended. Will follow my ceel [phone]  Joshua Pittman 11/09/2019, 9:41 AM

## 2019-11-09 NOTE — Progress Notes (Signed)
The pt is alert x4, spanish speaking, seems to be in a lot of pain, PRN Labetalol and pain meds given, I will continue to assess.

## 2019-11-09 NOTE — Evaluation (Signed)
Physical Therapy Evaluation Patient Details Name: Joshua Pittman MRN: [id number] DOB: [birth year] Today's Date: 11/09/2019   History of Present Illness  79 year old male with past history of prostate cancer post radiation with normal PSA 10/27/2019 presents with left lower abdominal pelvic pain that radiates into his left thigh sometimes into his left calf.  He has never had any other cancers other than prostate cancer.  Patient has a 10.8 x 8.2 x 13.2 mass with some cystic appearance in it.  Clinical Impression  Pt admitted with above diagnosis.  Pt currently with functional limitations due to the deficits listed below (see PT Problem List). Pt will benefit from skilled PT to increase their independence and safety with mobility to allow discharge to the venue listed below.  Used interpreter Joshua Pittman, [id number], for session.  Recommend home with family support and RW and 3-1 BSC.  Son shown how to use 3-1 BSC over toilet.  Also, verbally reviewed stair management with use of interpreter.     Follow Up Recommendations No PT follow up;Supervision for mobility/OOB    Equipment Recommendations  Rolling walker with 5" wheels;3in1 (PT)    Recommendations for Other Services       Precautions / Restrictions Precautions Precautions: Fall Restrictions Weight Bearing Restrictions: Yes LLE Weight Bearing: Weight bearing as tolerated      Mobility  Bed Mobility Overal bed mobility: Modified Independent                Transfers Overall transfer level: Needs assistance Equipment used: Rolling walker (2 wheeled) Transfers: Sit to/from Stand Sit to Stand: Min guard         General transfer comment: min/guard for safety and for hand placement  Ambulation/Gait Ambulation/Gait assistance: Min guard Gait Distance (Feet): 10 Feet(x2) Assistive device: Rolling walker (2 wheeled) Gait Pattern/deviations: Decreased stance time - left;Decreased step length - right;Antalgic;Trunk flexed      General Gait Details: Pt with antalgic gait pattern with decreased stance time on the L. Unable to ambulate farther due to pain.  Stairs Stairs: Yes       General stair comments: Did not do stairs due to pain, but verbally reviewed with pt and family with use of interpreter.  Wheelchair Mobility    Modified Rankin (Stroke Patients Only)       Balance Overall balance assessment: Needs assistance Sitting-balance support: Feet supported Sitting balance-Joshua Pittman: Good     Standing balance support: During functional activity Standing balance-Joshua Pittman: Fair Standing balance comment: Able to use urinal with min/guard                             Pertinent Vitals/Pain Pain Assessment: 0-10 Pain Score: 8  Pain Location: L LE with ambulation Pain Descriptors / Indicators: Grimacing Pain Intervention(s): Limited activity within patient's tolerance;Monitored during session;Patient requesting pain meds-RN notified;Repositioned    Home Living Family/patient expects to be discharged to:: Private residence Living Arrangements: Spouse/significant other Available Help at Discharge: Family Type of Home: House Home Access: Stairs to enter Entrance Stairs-Rails: Left;Right;Can reach both Technical brewer of Steps: 3 Home Layout: One level Home Equipment: Cane - single point      Prior Function Level of Independence: Independent with assistive device(s)         Comments: Amb with cane     Hand Dominance        Extremity/Trunk Assessment        Lower Extremity Assessment Lower Extremity Assessment: LLE deficits/detail;Overall  WFL for tasks assessed LLE: Unable to fully assess due to pain       Communication   Communication: Prefers language other than [place];Interpreter utilized  Cognition Arousal/Alertness: Awake/alert Behavior During Therapy: WFL for tasks assessed/performed Overall Cognitive Status: Within Functional Limits for tasks  assessed                                        General Comments      Exercises     Assessment/Plan    PT Assessment Patient needs continued PT services  PT Problem List Decreased activity tolerance;Decreased balance;Decreased mobility;Pain;Decreased strength       PT Treatment Interventions DME instruction;Gait training;Stair training;Functional mobility training;Therapeutic activities;Therapeutic exercise;Balance training    PT Goals (Current goals can be found in the Care Plan section)  Acute Rehab PT Goals Patient Stated Goal: get a walker for home use PT Goal Formulation: With patient/family Time For Goal Achievement: 11/23/19 Potential to Achieve Goals: Good    Frequency Min 3X/week   Barriers to discharge        Co-evaluation               AM-PAC PT "6 Clicks" Mobility  Outcome Measure Help needed turning from your back to your side while in a flat bed without using bedrails?: None Help needed moving from lying on your back to sitting on the side of a flat bed without using bedrails?: None Help needed moving to and from a bed to a chair (including a wheelchair)?: None Help needed standing up from a chair using your arms (e.g., wheelchair or bedside chair)?: A Little Help needed to walk in hospital room?: A Little Help needed climbing 3-5 steps with a railing? : A Little 6 Click Score: 21    End of Session   Activity Tolerance: Patient limited by pain Patient left: in chair;with call bell/phone within reach;with chair alarm set Nurse Communication: Mobility status PT Visit Diagnosis: Difficulty in walking, not elsewhere classified (R26.2);Pain Pain - Right/Left: Left Pain - part of body: Leg    Time: 1246-1313 PT Time Calculation (min) (ACUTE ONLY): 27 min   Charges:   PT Evaluation $PT Eval Moderate Complexity: 1 Mod PT Treatments $Gait Training: 8-22 mins        Joshua Pittman L. Joshua Pittman, [place] Pager [phone] 11/09/2019   Joshua Pittman 11/09/2019, 1:29 PM

## 2019-11-09 NOTE — Progress Notes (Signed)
Nsg Discharge Note  Admit Date:  11/07/2019 Discharge date: 11/09/2019   Monserrate Rojas-Piloto to be D/C'd home per MD order. Patient/caregiver able to verbalize understanding.  Discharge Medication: Allergies as of 11/09/2019   No Known Allergies     Medication List    STOP taking these medications   HYDROmorphone 2 MG tablet Commonly known as: Dilaudid   predniSONE 20 MG tablet Commonly known as: DELTASONE   traMADol 50 MG tablet Commonly known as: ULTRAM     TAKE these medications   amLODipine 10 MG tablet Commonly known as: NORVASC Take 1 tablet (10 mg total) by mouth daily. Start taking on: Nov 10, 2019   HYDROcodone-acetaminophen 5-325 MG tablet Commonly known as: NORCO/VICODIN Take 1 tablet by mouth every 4 (four) hours as needed for moderate pain. What changed:   how much to take  when to take this  reasons to take this   losartan 25 MG tablet Commonly known as: COZAAR Take 1 tablet (25 mg total) by mouth daily. Start taking on: Nov 10, 2019   meloxicam 7.5 MG tablet Commonly known as: Mobic Take 1 tablet (7.5 mg total) by mouth daily as needed for pain. Take 1 tablet daily for hip pain. What changed:   how much to take  how to take this  when to take this  reasons to take this   metFORMIN 500 MG 24 hr tablet Commonly known as: Glucophage XR Take 1 tablet (500 mg total) by mouth daily with breakfast.            Durable Medical Equipment  (From admission, onward)         Start     Ordered   11/09/19 1517  For home use only DME 3 n 1  Once     11/09/19 1516   11/09/19 1411  For home use only DME Walker rolling  Once    Question Answer Comment  Walker: With 5 Inch Wheels   Patient needs a walker to treat with the following condition Balance disorder      11/09/19 1410          Discharge Assessment: Vitals:   11/09/19 1028 11/09/19 1316  BP: (!) 157/71 (!) 154/70  Pulse: 71 77  Resp: 20 18  Temp: 98.2 F (36.8 C) 98.7 F  (37.1 C)  SpO2: 97% 96%   Skin clean, dry and intact without evidence of skin break down, no evidence of skin tears noted. IV catheter discontinued intact. Site without signs and symptoms of complications - no redness or edema noted at insertion site, patient denies c/o pain - only slight tenderness at site.  Dressing with slight pressure applied.  D/c Instructions-Education: Discharge instructions given to patient/family with verbalized understanding. D/c education completed with patient/family including follow up instructions, medication list, d/c activities limitations if indicated, with other d/c instructions as indicated by MD - patient able to verbalize understanding, all questions fully answered. Patient instructed to return to ED, call 911, or call MD for any changes in condition.  Patient escorted via [place], and D/C home via private auto.  Atilano Ina, RN 11/09/2019 5:07 PM

## 2019-11-09 NOTE — TOC Initial Note (Signed)
Transition of Care [facility]) - Initial/Assessment Note    Patient Details  Name: Joshua Pittman MRN: [id number] Date of Birth: 18-Mar-1941  Transition of Care (TOC) CM/SW Contact:    Joaquin Courts, RN Phone Number: 11/09/2019, 3:19 PM  Clinical Narrative:                 [employer] to deliver rolling walker and 3in1 to bedside for home use.  Expected Discharge Plan: Home/Self Care Barriers to Discharge: No Barriers Identified   Patient Goals and CMS Choice Patient states their goals for this hospitalization and ongoing recovery are:: to go home CMS Medicare.gov Compare Post Acute Care list provided to:: Patient Choice offered to / list presented to : Patient  Expected Discharge Plan and Services Expected Discharge Plan: Home/Self Care   Discharge Planning Services: CM Consult Post Acute Care Choice: Durable Medical Equipment Living arrangements for the past 2 months: Apartment Expected Discharge Date: 11/09/19               DME Arranged: Gilford Rile rolling, 3-N-1 DME Agency: [employer] Date DME Agency Contacted: 11/09/19 Time DME Agency Contacted: [phone] Representative spoke with at DME Agency: [place]: NA [place] Agency: NA        Prior Living Arrangements/Services Living arrangements for the past 2 months: Apartment   Patient language and need for interpreter reviewed:: Yes Do you feel safe going back to the place where you live?: Yes      Need for Family Participation in Patient Care: No (Comment) Care giver support system in place?: No (comment)   Criminal Activity/Legal Involvement Pertinent to Current Situation/Hospitalization: No - Comment as needed  Activities of Daily Living Home Assistive Devices/Equipment: Cane (specify quad or straight)(straight) ADL Screening (condition at time of admission) Patient's cognitive ability adequate to safely complete daily activities?: Yes Is the patient deaf or have difficulty hearing?: No Does the patient have  difficulty seeing, even when wearing glasses/contacts?: No Does the patient have difficulty concentrating, remembering, or making decisions?: No Patient able to express need for assistance with ADLs?: No Does the patient have difficulty dressing or bathing?: No Independently performs ADLs?: Yes (appropriate for developmental age) Does the patient have difficulty walking or climbing stairs?: Yes Weakness of Legs: Both Weakness of Arms/Hands: Both  Permission Sought/Granted                  Emotional Assessment              Admission diagnosis:  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 Mass of iliopsoas muscle group 11/08/2019  . Hypertensive urgency 11/08/2019  . Intractable pain 11/08/2019  . Left thigh pain 11/08/2019  . Prostate cancer ([place]) 04/14/2014  . Malignant neoplasm of prostate ([place]) 03/27/2014   PCP:  Sherald Hess., MD Pharmacy:   [facility] [id number] [place] [address] Phone: [phone] Fax: [phone]     Social Determinants of Health (SDOH) Interventions    Readmission Risk Interventions No flowsheet data found.

## 2019-11-12 LAB — CYTOLOGY - NON PAP

## 2019-11-13 LAB — AEROBIC/ANAEROBIC CULTURE W GRAM STAIN (SURGICAL/DEEP WOUND)
Culture: NO GROWTH
Gram Stain: NONE SEEN
Special Requests: NORMAL

## 2019-11-19 ENCOUNTER — Telehealth: Payer: Self-pay | Admitting: Hematology and Oncology

## 2019-11-19 NOTE — Telephone Encounter (Signed)
A new patient appt has been scheduled for [name] to see Dr. Lorenso Courier on 6/2 at 2pm. Appt date and time was given to the pt's grandson, [name].

## 2019-11-20 ENCOUNTER — Inpatient Hospital Stay: Payer: Medicaid Other

## 2019-11-20 ENCOUNTER — Other Ambulatory Visit: Payer: Self-pay

## 2019-11-20 ENCOUNTER — Inpatient Hospital Stay: Payer: Medicaid Other | Attending: Hematology and Oncology | Admitting: Hematology and Oncology

## 2019-11-20 VITALS — BP 140/62 | HR 82 | Temp 98.4°F | Resp 18 | Ht 61.0 in | Wt 139.6 lb

## 2019-11-20 DIAGNOSIS — M79605 Pain in left leg: Secondary | ICD-10-CM | POA: Diagnosis not present

## 2019-11-20 DIAGNOSIS — C61 Malignant neoplasm of prostate: Secondary | ICD-10-CM

## 2019-11-20 DIAGNOSIS — C495 Malignant neoplasm of connective and soft tissue of pelvis: Secondary | ICD-10-CM | POA: Diagnosis present

## 2019-11-20 DIAGNOSIS — Z7984 Long term (current) use of oral hypoglycemic drugs: Secondary | ICD-10-CM | POA: Diagnosis not present

## 2019-11-20 DIAGNOSIS — F419 Anxiety disorder, unspecified: Secondary | ICD-10-CM | POA: Insufficient documentation

## 2019-11-20 DIAGNOSIS — C499 Malignant neoplasm of connective and soft tissue, unspecified: Secondary | ICD-10-CM

## 2019-11-20 DIAGNOSIS — F17210 Nicotine dependence, cigarettes, uncomplicated: Secondary | ICD-10-CM | POA: Diagnosis not present

## 2019-11-20 DIAGNOSIS — M11269 Other chondrocalcinosis, unspecified knee: Secondary | ICD-10-CM | POA: Diagnosis not present

## 2019-11-20 DIAGNOSIS — K5900 Constipation, unspecified: Secondary | ICD-10-CM | POA: Diagnosis not present

## 2019-11-20 DIAGNOSIS — Z8546 Personal history of malignant neoplasm of prostate: Secondary | ICD-10-CM | POA: Diagnosis not present

## 2019-11-20 DIAGNOSIS — R630 Anorexia: Secondary | ICD-10-CM | POA: Insufficient documentation

## 2019-11-20 DIAGNOSIS — I499 Cardiac arrhythmia, unspecified: Secondary | ICD-10-CM | POA: Diagnosis not present

## 2019-11-20 DIAGNOSIS — Z7982 Long term (current) use of aspirin: Secondary | ICD-10-CM | POA: Insufficient documentation

## 2019-11-20 DIAGNOSIS — Z79899 Other long term (current) drug therapy: Secondary | ICD-10-CM | POA: Diagnosis not present

## 2019-11-20 DIAGNOSIS — G893 Neoplasm related pain (acute) (chronic): Secondary | ICD-10-CM | POA: Insufficient documentation

## 2019-11-20 MED ORDER — OXYCODONE HCL 5 MG PO TABS: 5.0000 mg | ORAL_TABLET | ORAL | 0 refills | Status: DC | PRN

## 2019-11-20 MED ORDER — OXYCODONE HCL ER 20 MG PO T12A: 20.0000 mg | EXTENDED_RELEASE_TABLET | Freq: Two times a day (BID) | ORAL | 0 refills | Status: DC

## 2019-11-20 NOTE — Progress Notes (Signed)
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 CT abd/pelvis showed 10.8 cm transverse by 8.2 cm AP by 13.2 cm craniocaudad heterogenous complex cystic mass within the left iliacus and iliopsoas muscle 2) 11/08/2019: CT guided biopsy and aspiration were performed with pathology revealing "high grade malignancy".  3) 11/20/2019: establish care with Dr. Lorenso Courier   CHIEF COMPLAINTS/PURPOSE OF CONSULTATION:  "Mass on imaging "  HISTORY OF PRESENTING ILLNESS:  Shaheen Rojas-Piloto 79 y.o. male with medical history significant for prostate cancer s/p resection in 2015 who presents for evaluation of a newly diagnosed sarcoma.   On review of the previous records Mr. Thomes Dinning presented to the emergency department on 11/07/2019 with 3 to 4 weeks of left leg pain.  He had a CT abdomen pelvis the time which showed a 10.8 cm transverse by 8.2 cm heterogeneous complex STIC mass within the left iliac [place] and iliopsoas muscle.  On 11/08/2019 the patient underwent a CT-guided biopsy and aspiration with pathology revealing a high-grade malignancy most concerning for sarcoma.  The patient was referred to oncology for further evaluation and management.  On exam today Mr. Thomes Dinning is accompanied by his wife and niece.  Our visit is assisted by a hospital approved interpreter.  The patient notes that his symptoms began approximately 4 weeks ago and in the interim he has had 3 emergency room visits.  The pain can reach 10 out of 10 in severity starts in the hip and extends all the way down to his ankle.  He notes that he has not been able to do much walking with the amount of pain that he is experiencing.  He is able to get back and forth to the  bathroom but in order to get into a vehicle or car he needs 2 person assist and basically needs to be picked up.  He notes that he has lost a few pounds of weight since this all began.  He otherwise denies having any issues with fevers, chills, sweats, nausea, vomiting or diarrhea.  He has had no issues with bleeding or bruising.  A full 10 point ROS is listed below.  MEDICAL HISTORY:  Past Medical History:  Diagnosis Date  . Arrhythmia   . Prostate cancer ([place]) 02/18/14   gleason 8    SURGICAL HISTORY: Past Surgical History:  Procedure Laterality Date  . INGUINAL HERNIA REPAIR     x 2  . LYMPHADENECTOMY Bilateral 04/14/2014   Procedure: LYMPHADENECTOMY;  Surgeon: [name], MD;  Location: [facility];  Service: Urology;  Laterality: Bilateral;  . PROSTATE BIOPSY  02/18/14   gleason 8, vol 50 cc  . ROBOT ASSISTED LAPAROSCOPIC RADICAL PROSTATECTOMY N/A 04/14/2014   Procedure: ROBOTIC ASSISTED LAPAROSCOPIC RADICAL PROSTATECTOMY LEVEL 3;  Surgeon: [name], MD;  Location: [facility];  Service: Urology;  Laterality: N/A;    SOCIAL HISTORY: Social History   Socioeconomic History  . Marital status: Married    Spouse name: Not on file  . Number of children: Not on file  . Years of education: Not on file  . Highest education level: Not on file  Occupational History  . Not on file  Tobacco Use  . Smoking status: Current Every Day Smoker    Packs/day: 0.25  Years: 40.00    Pack years: 10.00    Types: Cigarettes  . Smokeless tobacco: Never Used  . Tobacco comment: 3-4 cigs daily  Substance and Sexual Activity  . Alcohol use: Yes    Comment: beer socially on the weekends  . Drug use: No  . Sexual activity: Yes  Other Topics Concern  . Not on file  Social History Narrative  . Not on file   Social Determinants of Health   Financial Resource Strain:   . Difficulty of Paying Living Expenses:   Food Insecurity:   . Worried About Charity fundraiser in the Last Year:   . Arts development officer in the Last Year:   Transportation Needs:   . Film/video editor (Medical):   Marland Kitchen Lack of Transportation (Non-Medical):   Physical Activity:   . Days of Exercise per Week:   . Minutes of Exercise per Session:   Stress:   . Feeling of Stress :   Social Connections:   . Frequency of Communication with Friends and Family:   . Frequency of Social Gatherings with Friends and Family:   . Attends Religious Services:   . Active Member of Clubs or Organizations:   . Attends Archivist Meetings:   Marland Kitchen Marital Status:   Intimate Partner Violence:   . Fear of Current or Ex-Partner:   . Emotionally Abused:   Marland Kitchen Physically Abused:   . Sexually Abused:     FAMILY HISTORY: Family History  Problem Relation Age of Onset  . Asthma Mother   . Cancer Neg Hx     ALLERGIES:  has No Known Allergies.  MEDICATIONS:  Current Outpatient Medications  Medication Sig Dispense Refill  . amLODipine (NORVASC) 10 MG tablet Take 1 tablet (10 mg total) by mouth daily. 30 tablet 1  . HYDROcodone-acetaminophen (NORCO/VICODIN) 5-325 MG tablet Take 1 tablet by mouth every 4 (four) hours as needed for moderate pain. 20 tablet 0  . losartan (COZAAR) 25 MG tablet Take 1 tablet (25 mg total) by mouth daily. 30 tablet 1  . meloxicam (MOBIC) 7.5 MG tablet Take 1 tablet (7.5 mg total) by mouth daily as needed for pain. Take 1 tablet daily for hip pain. 30 tablet 0  . metFORMIN (GLUCOPHAGE XR) 500 MG 24 hr tablet Take 1 tablet (500 mg total) by mouth daily with breakfast. 30 tablet 1   No current facility-administered medications for this visit.    REVIEW OF SYSTEMS:   Constitutional: ( - ) fevers, ( - )  chills , ( - ) night sweats Eyes: ( - ) blurriness of vision, ( - ) double vision, ( - ) watery eyes Ears, nose, mouth, throat, and face: ( - ) mucositis, ( - ) sore throat Respiratory: ( - ) cough, ( - ) dyspnea, ( - ) wheezes Cardiovascular: ( - ) palpitation, ( - ) chest discomfort, ( - )  lower extremity swelling Gastrointestinal:  ( - ) nausea, ( - ) heartburn, ( - ) change in bowel habits Skin: ( - ) abnormal skin rashes Lymphatics: ( - ) new lymphadenopathy, ( - ) easy bruising Neurological: ( - ) numbness, ( - ) tingling, ( - ) new weaknesses Behavioral/Psych: ( - ) mood change, ( - ) new changes  All other systems were reviewed with the patient and are negative.  PHYSICAL EXAMINATION: ECOG PERFORMANCE STATUS: 2 - Symptomatic, <50% confined to bed  Vitals:   11/20/19 1348 11/20/19 1350  BP: (!) 148/60 140/62  Pulse: 82   Resp: 18   Temp: 98.4 F (36.9 C)   SpO2: 99%    Filed Weights   11/20/19 1348  Weight: 139 lb 9.6 oz (63.3 kg)    GENERAL: chronically ill appearing elderly [place] male in NAD  SKIN: skin color, texture, turgor are normal, no rashes or significant lesions EYES: conjunctiva are pink and non-injected, sclera clear LUNGS: clear to auscultation and percussion with normal breathing effort HEART: regular rate & rhythm and no murmurs and no lower extremity edema Musculoskeletal: no cyanosis of digits and no clubbing  PSYCH: alert & oriented x 3, fluent speech NEURO: no focal motor/sensory deficits  LABORATORY DATA:  I have reviewed the data as listed CBC Latest Ref Rng & Units 11/07/2019 10/27/2019 02/09/2017  WBC 4.0 - 10.5 K/uL 8.8 8.0 4.8  Hemoglobin 13.0 - 17.0 g/dL 13.3 13.1 12.8(L)  Hematocrit 39.0 - 52.0 % 39.5 39.1 38.0(L)  Platelets 150 - 400 K/uL 360 326 239    CMP Latest Ref Rng & Units 11/07/2019 10/27/2019 02/09/2017  Glucose 70 - 99 mg/dL 147(H) 237(H) 93  BUN 8 - 23 mg/dL 15 17 15   Creatinine 0.61 - 1.24 mg/dL 0.76 0.81 0.79  Sodium 135 - 145 mmol/L 133(L) 133(L) 138  Potassium 3.5 - 5.1 mmol/L 4.3 4.4 4.4  Chloride 98 - 111 mmol/L 97(L) 99 107  CO2 22 - 32 mmol/L 26 27 27   Calcium 8.9 - 10.3 mg/dL 9.3 9.0 9.1  Total Protein 6.5 - 8.1 g/dL - 6.8 -  Total Bilirubin 0.3 - 1.2 mg/dL - 0.6 -  Alkaline Phos 38 - 126 U/L - 85 -    AST 15 - 41 U/L - 16 -  ALT 0 - 44 U/L - 13 -     PATHOLOGY: SURGICAL PATHOLOGY  CASE: [id number]  PATIENT: Terique ROJAS-PILOTO  Surgical Pathology Report   Clinical History: unknown left pelvic psoas mass (kp)   FINAL MICROSCOPIC DIAGNOSIS:   A. PELVIC, MASS, LEFT, BIOPSY;  - High grade malignancy.  - See comment.   COMMENT:   The malignant cells are positive for vimentin and negative for  cytokeratin 20, PSA and prostein (prostate markers), desmin, EMA,  cytokeratin AE1/AE3, MOC-31, cytokeratin 7, actin, and CD45. There is  some staining for CD68. On the HE stained slides, there are some  rhabdoid appearing cells.  Overall, the findings are non-specific, but do reveal a high grade  malignant process. Additional studies can be performed upon clinician  request.   GROSS DESCRIPTION:   Received in formalin are 3 cores of soft white tissue measuring 2.1 to  2.5 cm in length and each less than 0.1 cm in diameter. The specimen is  entirely submitted in 2 cassettes. [facility] 11/08/2019)  Final Diagnosis performed by Enid Cutter, MD.  Electronically signed  11/12/2019  Technical component performed at [facility], [place]  [address], [place], [place] [address].  Professional component performed at [employer]. [facility],  [place] [address] [place], [place] [address].  Immunohistochemistry Technical component (if applicable) was performed  at [facility]. [address] [place],  [place], [place] [address].  IMMUNOHISTOCHEMISTRY DISCLAIMER (if applicable):  Some of these immunohistochemical stains may have been developed and the  performance characteristics determine by [facility]. Some  may not have been cleared or approved by the U.S. Food and Drug  Administration. The FDA has determined that such clearance or approval  is  not necessary. This test is used for clinical purposes. It should not  be regarded as  investigational or for research. This laboratory is  certified under the [place]  (CLIA-88) as qualified to perform high complexity clinical laboratory  testing. The controls stained appropriately.   RADIOGRAPHIC STUDIES: I have personally reviewed the radiological images as listed and agreed with the findings in the report: large mass in the iliopsoas muscle on the left side.   CT ABDOMEN PELVIS W CONTRAST  Result Date: 11/07/2019 CLINICAL DATA:  Right abdominal and hip pain, stage IV prostate cancer EXAM: CT ABDOMEN AND PELVIS WITH CONTRAST TECHNIQUE: Multidetector CT imaging of the abdomen and pelvis was performed using the standard protocol following bolus administration of intravenous contrast. CONTRAST:  188mL OMNIPAQUE IOHEXOL 300 MG/ML  SOLN COMPARISON:  CT 07/26/2017 FINDINGS: Lower chest: Lung bases demonstrate no acute consolidation or pleural effusion. Hepatobiliary: No focal liver abnormality is seen. No gallstones, gallbladder wall thickening, or biliary dilatation. Pancreas: Unremarkable. No pancreatic ductal dilatation or surrounding inflammatory changes. Spleen: Normal in size without focal abnormality. Adrenals/Urinary Tract: Adrenal glands are normal. Kidneys show no hydronephrosis. The urinary bladder is unremarkable Stomach/Bowel: Stomach is nonenlarged. No dilated small bowel. No bowel wall thickening. Negative appendix. Vascular/Lymphatic: Moderate aortic atherosclerosis without aneurysm. No suspicious adenopathy. Reproductive: Status post prostatectomy. Other: Negative for pelvic effusion or free air. Musculoskeletal: Large heterogeneous complex cystic mass within the left iliacus and iliopsoas muscle. There is mass effect on and displacement of the left psoas major muscle. The mass measures approximately 10.8 cm transverse by 8.2 cm AP by 13.2 cm craniocaudad. Mass appears to contain internal thick septations and possible soft tissue  components. There is no underlying bony destructive change. Lucency and sclerosis of the right pelvis with slight bony expansion suggesting pagetoid changes. 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 No fracture is seen. IMPRESSION: 1. Large heterogenous complex cystic mass within the left iliacus and iliopsoas muscle. Differential considerations include organizing/chronic hematoma, neoplasm, and abscess, though not much surrounding inflammatory change. Aspiration/tissue sampling may be considered for further evaluation. 2. 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 No fracture is seen Electronically Signed   By: Donavan Foil M.D.   On: 11/07/2019 23:23   CT ASPIRATION  Result Date: 11/08/2019 INDICATION: Left hip and leg pain, left pelvic sidewall complex large partially cystic mass EXAM: CT-GUIDED ASPIRATION AND CORE BIOPSY OF THE LEFT PELVIC SIDEWALL MASS MEDICATIONS: 1% LIDOCAINE LOCAL ANESTHESIA/SEDATION: Moderate (conscious) sedation was employed during this procedure. A total of Versed 4.0 mg and Fentanyl 100 mcg was administered intravenously. Moderate Sedation Time: 10 minutes. The patient's level of consciousness and vital signs were monitored continuously by radiology nursing throughout the procedure under my direct supervision. FLUOROSCOPY TIME:  Fluoroscopy Time: NONE. COMPLICATIONS: None immediate. PROCEDURE: Informed written consent was obtained from the patient after a thorough discussion of the procedural risks, benefits and alternatives. All questions were addressed. Maximal Sterile Barrier Technique was utilized including caps, mask, sterile gowns, sterile gloves, sterile drape, hand hygiene and skin antiseptic. A timeout was performed prior to the initiation of the procedure. Previous imaging reviewed. Patient positioned supine. Noncontrast localization CT  performed. The large left pelvic sidewall complex mass was localized and marked for an anterolateral approach. Under sterile conditions and local anesthesia, distally an 18 gauge needle was advanced to the central aspect of  the lesion. Syringe aspiration performed. Small amount of brown serosanguineous fluid obtained. No purulence sample. Sample was sent for cytology and culture. Next, a 33 gauge coaxial biopsy needle was advanced into the more peripheral anterior aspect of the lesion along the solid component. Needle position confirmed with CT. 18 gauge core biopsies obtained. These were placed in formalin. Needle removed. Postprocedure imaging demonstrates no hemorrhage or hematoma. Patient tolerated biopsy well. Samples were intact and non fragmented. IMPRESSION: Successful CT-guided aspiration of the left pelvic sidewall complex partially cystic mass for cytology and culture Successful CT-guided core biopsy of the left pelvic sidewall complex masses well. Sent for pathology. Electronically Signed   By: Jerilynn Mages.  Shick M.D.   On: 11/08/2019 14:54   CT BIOPSY  Result Date: 11/08/2019 INDICATION: Left hip and leg pain, left pelvic sidewall complex large partially cystic mass EXAM: CT-GUIDED ASPIRATION AND CORE BIOPSY OF THE LEFT PELVIC SIDEWALL MASS MEDICATIONS: 1% LIDOCAINE LOCAL ANESTHESIA/SEDATION: Moderate (conscious) sedation was employed during this procedure. A total of Versed 4.0 mg and Fentanyl 100 mcg was administered intravenously. Moderate Sedation Time: 10 minutes. The patient's level of consciousness and vital signs were monitored continuously by radiology nursing throughout the procedure under my direct supervision. FLUOROSCOPY TIME:  Fluoroscopy Time: NONE. COMPLICATIONS: None immediate. PROCEDURE: Informed written consent was obtained from the patient after a thorough discussion of the procedural risks, benefits and alternatives. All questions were addressed. Maximal Sterile Barrier Technique was  utilized including caps, mask, sterile gowns, sterile gloves, sterile drape, hand hygiene and skin antiseptic. A timeout was performed prior to the initiation of the procedure. Previous imaging reviewed. Patient positioned supine. Noncontrast localization CT performed. The large left pelvic sidewall complex mass was localized and marked for an anterolateral approach. Under sterile conditions and local anesthesia, distally an 18 gauge needle was advanced to the central aspect of the lesion. Syringe aspiration performed. Small amount of brown serosanguineous fluid obtained. No purulence sample. Sample was sent for cytology and culture. Next, a 55 gauge coaxial biopsy needle was advanced into the more peripheral anterior aspect of the lesion along the solid component. Needle position confirmed with CT. 18 gauge core biopsies obtained. These were placed in formalin. Needle removed. Postprocedure imaging demonstrates no hemorrhage or hematoma. Patient tolerated biopsy well. Samples were intact and non fragmented. IMPRESSION: Successful CT-guided aspiration of the left pelvic sidewall complex partially cystic mass for cytology and culture Successful CT-guided core biopsy of the left pelvic sidewall complex masses well. Sent for pathology. Electronically Signed   By: Jerilynn Mages.  Shick M.D.   On: 11/08/2019 14:54   DG Knee Complete 4 Views Left  Result Date: 11/07/2019 CLINICAL DATA:  Pain.  No trauma history submitted. EXAM: LEFT KNEE - COMPLETE 4+ VIEW COMPARISON:  None. FINDINGS: No acute fracture or dislocation. No joint effusion. Moderate medial compartment joint space narrowing and osteophyte formation. Enthesophytes at the quadriceps insertion and patellar tendon origin. Subtle chondrocalcinosis within the menisci. IMPRESSION: Degenerative change, without acute osseous finding. Chondrocalcinosis within the menisci, consistent with calcium pyrophosphate deposition disease. Electronically Signed   By: Abigail Miyamoto M.D.    On: 11/07/2019 19:21    ASSESSMENT & PLAN Tanay Rojas-Piloto 79 y.o. male with medical history significant for prostate cancer s/p resection in 2015 who presents for evaluation of a newly diagnosed sarcoma.  After review the labs, review the imaging, and discussion with the patient the findings are most consistent with a sarcoma.  Given the size and location of this tumor I am  uncertain that it is resectable.  Additionally the type of sarcoma that this is what have a strong bearing on whether or not resection would be an appropriate treatment course.  As our pathology department was unable to pin down the type of sarcoma we are dealing with I have requested that the tissue be sent for molecular testing as well as a second opinion with a sarcoma expert in [place].  We are currently awaiting the results of that testing.  In the interim I think it would be reasonable to have the patient referred to [facility] for evaluation by an oncological surgeon.  We could get a preliminary review determining if this mass is potentially resectable.  Mr. Thomes Dinning despite his age does appear to be fit with no other major medical issues.  I do worry that his age alone may preclude him from being a surgical candidate.  For pain control he is currently taking hydrocodone with acetaminophen.  Given that he is taking this every 4 hours I would recommend the addition of a long-acting opioid medication in order to assist with his pain control and transitioning to oxycodone (so that we do not have limits on dosing due to accompanying medications).   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 As the type of sarcoma can have a large bearing on chemotherapy and treatment options I have requested the pathology have this reviewed by a specialist in sarcoma. The tissue is being sent to a specialist [place] for  review. --based on the imaging I do not believe this sarcoma would be amenable to resection. To be sure I would like a surgical evaluation. I would recommend the patient be evaluated by a Oncological surgeon specialist. Will work on finding a local specialist to review the case --likely treatment will consist of chemotherapy vs radiation. Will hold on port placement until we know the final pathology of this tumor --discussed the likely treatments moving forward with the patient and his family. Noted that resection was not a likely option moving forward --will make placeholder appointment in 2 weeks time to assess his level of pain control.   #Pain Control --patient has been taking hydrocodone/acetaminophen with little relief, at a dose of 5mg  q4H PRN. Patient notes he has been taking this every 4 hours  --will prescribe oxycontin 20mg  q12H with oxycodone 5-10mg  q4H PRN once he has completed his supply of hydrocodone. --we can consider the addition of acetaminophen/ibuprofen to help better control the pain.  --continue to monitor closely.   No orders of the defined types were placed in this encounter.   All questions were answered. The patient knows to call the clinic with any problems, questions or concerns.  A total of more than 60 minutes were spent on this encounter and over half of that time was spent on counseling and coordination of care as outlined above.   Ledell Peoples, MD Department of Hematology/Oncology [place] [facility] Phone: [phone] Pager: [phone] Email: [email or web address]  11/20/2019 2:13 PM

## 2019-11-22 ENCOUNTER — Telehealth: Payer: Self-pay | Admitting: Hematology and Oncology

## 2019-11-22 NOTE — Telephone Encounter (Signed)
Scheduled appts per 6/2 los, and phone call from [facility] interpreter [place]. Almyra Free will call pt and inform pt of new appts.

## 2019-11-26 ENCOUNTER — Inpatient Hospital Stay (HOSPITAL_BASED_OUTPATIENT_CLINIC_OR_DEPARTMENT_OTHER): Payer: Medicaid Other | Admitting: Medical

## 2019-11-26 ENCOUNTER — Telehealth: Payer: Self-pay | Admitting: *Deleted

## 2019-11-26 ENCOUNTER — Other Ambulatory Visit: Payer: Self-pay

## 2019-11-26 VITALS — BP 133/75 | HR 89 | Temp 97.5°F | Resp 16 | Ht 61.0 in

## 2019-11-26 DIAGNOSIS — G893 Neoplasm related pain (acute) (chronic): Secondary | ICD-10-CM

## 2019-11-26 DIAGNOSIS — M6289 Other specified disorders of muscle: Secondary | ICD-10-CM

## 2019-11-26 DIAGNOSIS — C495 Malignant neoplasm of connective and soft tissue of pelvis: Secondary | ICD-10-CM | POA: Diagnosis not present

## 2019-11-26 MED ORDER — OXYCODONE HCL ER 20 MG PO T12A: 20.0000 mg | EXTENDED_RELEASE_TABLET | Freq: Two times a day (BID) | ORAL | 0 refills | Status: DC

## 2019-11-26 MED ORDER — DULOXETINE HCL 20 MG PO CPEP: 20.0000 mg | ORAL_CAPSULE | Freq: Every day | ORAL | 3 refills | Status: AC

## 2019-11-26 MED ORDER — HYDROCODONE-ACETAMINOPHEN 5-325 MG PO TABS: 1.0000 | ORAL_TABLET | ORAL | 0 refills | Status: DC | PRN

## 2019-11-26 NOTE — Telephone Encounter (Signed)
Received vm message from a family member requesting a refill. They did not say their name or which medication needed to be refilled. TCT patient via Translation Phone assistance. No answer but was able to have the translator leave a message for the family to call back about the refill request..

## 2019-11-26 NOTE — Telephone Encounter (Signed)
Received call back from pt's niece,, Joshua Pittman.  She states that [name] does not answer the phone and that calls should go to her.  She states that pt is having increasing pain and needs refills on both of his pain meds. She states she thinks his wife is giving him more than prescribed as his pain is so bad he is crying out most of the time.  Spoke with Dr. Lorenso Courier and advised him of the above.  Of note, the oxycontin was only 1 week supply and they are out of that medication.  There is approx 6 tablets left of the oxycodone 5 mg #60 ordered on 11/20/19. Joshua Pittman states that sometimes the wife gives him medicine every 2 hours. Pt is to be seen in the [facility] today.  Ok per Sandi Mealy, PA and Dr. Lorenso Courier.  TCT Joshua Pittman and advise d that she contact pt's wife and have [name] brought to the cancer center now for pain management.  She voiced understanding and will contact the pt's wife.  High priority scheduling message sent.

## 2019-11-27 ENCOUNTER — Telehealth: Payer: Self-pay | Admitting: *Deleted

## 2019-11-27 NOTE — Telephone Encounter (Signed)
TCT [facility] regarding possible referral. Spoke with Administrative assistant about referral for Sarcoma of Iliopsoas muscle.  She spoke with Dr. Clovis Riley and he will accept the referral for possible surgery/de-bulking possibilities. Fax's office notes, recent scans and demo graphics and lab reports to [phone]

## 2019-11-28 NOTE — Progress Notes (Signed)
[facility] Progress Note   Joshua Pittman [id number] 1940-08-28 79 y.o.  Joshua Pittman is managed by Dr. Narda Rutherford  Actively treated with chemotherapy/immunotherapy/hormonal therapy: no  Next scheduled appointment with provider: 12/04/2019  Assessment: Plan:    Neoplasm related pain - Plan: oxyCODONE (OXYCONTIN) 20 mg 12 hr tablet, DULoxetine (CYMBALTA) 20 MG capsule, HYDROcodone-acetaminophen (NORCO/VICODIN) 5-325 MG tablet  Mass of iliopsoas muscle group   Left lower extremity pain: The patient was seen with his wife and an interpreter today.  They report that he is not taking a long-acting pain medicine.  His chart shows that he had been prescribed OxyContin 20 mg every 12 hours.  They report that this was never given to them by the pharmacy.  The patient has been giving him 1 tablet of OxyContin 5 mg every 4 hours and has been following this with 1 tablet of Vicodin after approximately 3 hours.  They were given a refill of Vicodin and were given a prescription for OxyContin 20 mg every 12 hours.  I discussed with him that it is my hope that he will begin to get pain relief from his OxyContin and will then be able to use oxycodone immediate release for his breakthrough pain and will stop Vicodin.  He was additionally given a prescription for Cymbalta 20 mg once daily  Left iliopsoas mass:  The patient is pending a biopsy.  Please see After Visit Summary for patient specific instructions.  Future Appointments  Date Time Provider [place]  12/04/2019 11:00 AM [facility] None  12/04/2019 11:30 AM Orson Slick, MD [facility] None    No orders of the defined types were placed in this encounter.      Subjective:   Patient ID:  Joshua Pittman is a 79 y.o. (DOB 1941-01-20) male.  Chief Complaint: No chief complaint on file.   HPI Joshua Pittman  is a 79 y.o. male with a diagnosis of left iliopsoas  mass.  He is awaiting a biopsy.  He presents to the clinic today with his wife and interpreter.  He is having pain in his left lower extremity and is having anxiety because of his pain.  Review of his medications shows that he has a prescription for OxyContin 20 mg p.o. twice daily.  His family reports that they have not received this medication.  They report that the pharmacy did not fill it.  The patient's wife has been giving him oxycodone IR 5 mg every 4 hours.  She reports that she then gives him a Vicodin approximately 2 to 3 hours later if his pain continues.  He has been having some constipation which is controlled with his use of a laxative.   Medications: I have reviewed the patient's current medications.  Allergies: No Known Allergies  Past Medical History:  Diagnosis Date  . Arrhythmia   . Prostate cancer ([place]) 02/18/14   gleason 8    Past Surgical History:  Procedure Laterality Date  . INGUINAL HERNIA REPAIR     x 2  . LYMPHADENECTOMY Bilateral 04/14/2014   Procedure: LYMPHADENECTOMY;  Surgeon: [name], MD;  Location: [facility];  Service: Urology;  Laterality: Bilateral;  . PROSTATE BIOPSY  02/18/14   gleason 8, vol 50 cc  . ROBOT ASSISTED LAPAROSCOPIC RADICAL PROSTATECTOMY N/A 04/14/2014   Procedure: ROBOTIC ASSISTED LAPAROSCOPIC RADICAL PROSTATECTOMY LEVEL 3;  Surgeon: [name], MD;  Location: [facility];  Service: Urology;  Laterality: N/A;    Family History  Problem Relation Age of Onset  . Asthma Mother   . Cancer Neg Hx     Social History   Socioeconomic History  . Marital status: Married    Spouse name: Not on file  . Number of children: Not on file  . Years of education: Not on file  . Highest education level: Not on file  Occupational History  . Not on file  Tobacco Use  . Smoking status: Current Every Day Smoker    Packs/day: 0.25    Years: 40.00    Pack years: 10.00    Types: Cigarettes  . Smokeless tobacco: Never Used  . Tobacco comment: 3-4  cigs daily  Substance and Sexual Activity  . Alcohol use: Yes    Comment: beer socially on the weekends  . Drug use: No  . Sexual activity: Yes  Other Topics Concern  . Not on file  Social History Narrative  . Not on file   Social Determinants of Health   Financial Resource Strain:   . Difficulty of Paying Living Expenses:   Food Insecurity:   . Worried About Charity fundraiser in the Last Year:   . Arboriculturist in the Last Year:   Transportation Needs:   . Film/video editor (Medical):   Marland Kitchen Lack of Transportation (Non-Medical):   Physical Activity:   . Days of Exercise per Week:   . Minutes of Exercise per Session:   Stress:   . Feeling of Stress :   Social Connections:   . Frequency of Communication with Friends and Family:   . Frequency of Social Gatherings with Friends and Family:   . Attends Religious Services:   . Active Member of Clubs or Organizations:   . Attends Archivist Meetings:   Marland Kitchen Marital Status:   Intimate Partner Violence:   . Fear of Current or Ex-Partner:   . Emotionally Abused:   Marland Kitchen Physically Abused:   . Sexually Abused:     Past Medical History, Surgical history, Social history, and Family history were reviewed and updated as appropriate.   Please see review of systems for further details on the patient's review from today.   Review of Systems:  Review of Systems  Gastrointestinal: Positive for constipation.  Musculoskeletal: Positive for arthralgias, gait problem and myalgias.  Psychiatric/Behavioral: Positive for dysphoric mood. The patient is nervous/anxious.     Objective:   Physical Exam:  BP 133/75 (BP Location: Left Arm, Patient Position: Sitting)   Pulse 89   Temp (!) 97.5 F (36.4 C) (Temporal)   Resp 16   Ht 5\' 1"  (1.549 m)   SpO2 100%   BMI 26.38 kg/m  ECOG: 1  Physical Exam Constitutional:      General: He is not in acute distress.    Appearance: Normal appearance. He is not ill-appearing.  HENT:       Head: Normocephalic and atraumatic.  Cardiovascular:     Rate and Rhythm: Normal rate.  Pulmonary:     Effort: Pulmonary effort is normal. No respiratory distress.     Breath sounds: Normal breath sounds. No wheezing, rhonchi or rales.  Skin:    General: Skin is warm and dry.  Neurological:     Mental Status: He is alert.     Gait: Gait abnormal (The patient is ambulating with the use of a wheelchair.).     Lab Review:     Component Value Date/Time   NA 133 (L) 11/07/2019 2355  K 4.3 11/07/2019 2355   CL 97 (L) 11/07/2019 2355   CO2 26 11/07/2019 2355   GLUCOSE 147 (H) 11/07/2019 2355   BUN 15 11/07/2019 2355   CREATININE 0.76 11/07/2019 2355   CALCIUM 9.3 11/07/2019 2355   PROT 6.8 10/27/2019 1606   ALBUMIN 3.6 10/27/2019 1606   AST 16 10/27/2019 1606   ALT 13 10/27/2019 1606   ALKPHOS 85 10/27/2019 1606   BILITOT 0.6 10/27/2019 1606   GFRNONAA >60 11/07/2019 2355   GFRAA >60 11/07/2019 2355       Component Value Date/Time   WBC 8.8 11/07/2019 2355   RBC 4.21 (L) 11/07/2019 2355   HGB 13.3 11/07/2019 2355   HCT 39.5 11/07/2019 2355   PLT 360 11/07/2019 2355   MCV 93.8 11/07/2019 2355   MCH 31.6 11/07/2019 2355   MCHC 33.7 11/07/2019 2355   RDW 11.8 11/07/2019 2355   LYMPHSABS 1.0 11/07/2019 2355   MONOABS 0.8 11/07/2019 2355   EOSABS 0.1 11/07/2019 2355   BASOSABS 0.0 11/07/2019 2355   -------------------------------  Imaging from last 24 hours (if applicable):  Radiology interpretation: CT ABDOMEN PELVIS W CONTRAST  Result Date: 11/07/2019 CLINICAL DATA:  Right abdominal and hip pain, stage IV prostate cancer EXAM: CT ABDOMEN AND PELVIS WITH CONTRAST TECHNIQUE: Multidetector CT imaging of the abdomen and pelvis was performed using the standard protocol following bolus administration of intravenous contrast. CONTRAST:  161mL OMNIPAQUE IOHEXOL 300 MG/ML  SOLN COMPARISON:  CT 07/26/2017 FINDINGS: Lower chest: Lung bases demonstrate no acute consolidation  or pleural effusion. Hepatobiliary: No focal liver abnormality is seen. No gallstones, gallbladder wall thickening, or biliary dilatation. Pancreas: Unremarkable. No pancreatic ductal dilatation or surrounding inflammatory changes. Spleen: Normal in size without focal abnormality. Adrenals/Urinary Tract: Adrenal glands are normal. Kidneys show no hydronephrosis. The urinary bladder is unremarkable Stomach/Bowel: Stomach is nonenlarged. No dilated small bowel. No bowel wall thickening. Negative appendix. Vascular/Lymphatic: Moderate aortic atherosclerosis without aneurysm. No suspicious adenopathy. Reproductive: Status post prostatectomy. Other: Negative for pelvic effusion or free air. Musculoskeletal: Large heterogeneous complex cystic mass within the left iliacus and iliopsoas muscle. There is mass effect on and displacement of the left psoas major muscle. The mass measures approximately 10.8 cm transverse by 8.2 cm AP by 13.2 cm craniocaudad. Mass appears to contain internal thick septations and possible soft tissue components. There is no underlying bony destructive change. Lucency and sclerosis of the right pelvis with slight bony expansion suggesting pagetoid changes. 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 No fracture is seen. IMPRESSION: 1. Large heterogenous complex cystic mass within the left iliacus and iliopsoas muscle. Differential considerations include organizing/chronic hematoma, neoplasm, and abscess, though not much surrounding inflammatory change. Aspiration/tissue sampling may be considered for further evaluation. 2. 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 No fracture is seen Electronically Signed   By: Donavan Foil M.D.   On: 11/07/2019 23:23   CT ASPIRATION  Result Date: 11/08/2019 INDICATION: Left hip and leg pain, left pelvic sidewall complex large  partially cystic mass EXAM: CT-GUIDED ASPIRATION AND CORE BIOPSY OF THE LEFT PELVIC SIDEWALL MASS MEDICATIONS: 1% LIDOCAINE LOCAL ANESTHESIA/SEDATION: Moderate (conscious) sedation was employed during this procedure. A total of Versed 4.0 mg and Fentanyl 100 mcg was administered intravenously. Moderate Sedation Time: 10 minutes. The patient's level of consciousness and vital signs were monitored continuously by radiology nursing throughout the procedure under my  direct supervision. FLUOROSCOPY TIME:  Fluoroscopy Time: NONE. COMPLICATIONS: None immediate. PROCEDURE: Informed written consent was obtained from the patient after a thorough discussion of the procedural risks, benefits and alternatives. All questions were addressed. Maximal Sterile Barrier Technique was utilized including caps, mask, sterile gowns, sterile gloves, sterile drape, hand hygiene and skin antiseptic. A timeout was performed prior to the initiation of the procedure. Previous imaging reviewed. Patient positioned supine. Noncontrast localization CT performed. The large left pelvic sidewall complex mass was localized and marked for an anterolateral approach. Under sterile conditions and local anesthesia, distally an 18 gauge needle was advanced to the central aspect of the lesion. Syringe aspiration performed. Small amount of brown serosanguineous fluid obtained. No purulence sample. Sample was sent for cytology and culture. Next, a 43 gauge coaxial biopsy needle was advanced into the more peripheral anterior aspect of the lesion along the solid component. Needle position confirmed with CT. 18 gauge core biopsies obtained. These were placed in formalin. Needle removed. Postprocedure imaging demonstrates no hemorrhage or hematoma. Patient tolerated biopsy well. Samples were intact and non fragmented. IMPRESSION: Successful CT-guided aspiration of the left pelvic sidewall complex partially cystic mass for cytology and culture Successful CT-guided  core biopsy of the left pelvic sidewall complex masses well. Sent for pathology. Electronically Signed   By: Jerilynn Mages.  Shick M.D.   On: 11/08/2019 14:54   CT BIOPSY  Result Date: 11/08/2019 INDICATION: Left hip and leg pain, left pelvic sidewall complex large partially cystic mass EXAM: CT-GUIDED ASPIRATION AND CORE BIOPSY OF THE LEFT PELVIC SIDEWALL MASS MEDICATIONS: 1% LIDOCAINE LOCAL ANESTHESIA/SEDATION: Moderate (conscious) sedation was employed during this procedure. A total of Versed 4.0 mg and Fentanyl 100 mcg was administered intravenously. Moderate Sedation Time: 10 minutes. The patient's level of consciousness and vital signs were monitored continuously by radiology nursing throughout the procedure under my direct supervision. FLUOROSCOPY TIME:  Fluoroscopy Time: NONE. COMPLICATIONS: None immediate. PROCEDURE: Informed written consent was obtained from the patient after a thorough discussion of the procedural risks, benefits and alternatives. All questions were addressed. Maximal Sterile Barrier Technique was utilized including caps, mask, sterile gowns, sterile gloves, sterile drape, hand hygiene and skin antiseptic. A timeout was performed prior to the initiation of the procedure. Previous imaging reviewed. Patient positioned supine. Noncontrast localization CT performed. The large left pelvic sidewall complex mass was localized and marked for an anterolateral approach. Under sterile conditions and local anesthesia, distally an 18 gauge needle was advanced to the central aspect of the lesion. Syringe aspiration performed. Small amount of brown serosanguineous fluid obtained. No purulence sample. Sample was sent for cytology and culture. Next, a 7 gauge coaxial biopsy needle was advanced into the more peripheral anterior aspect of the lesion along the solid component. Needle position confirmed with CT. 18 gauge core biopsies obtained. These were placed in formalin. Needle removed. Postprocedure imaging  demonstrates no hemorrhage or hematoma. Patient tolerated biopsy well. Samples were intact and non fragmented. IMPRESSION: Successful CT-guided aspiration of the left pelvic sidewall complex partially cystic mass for cytology and culture Successful CT-guided core biopsy of the left pelvic sidewall complex masses well. Sent for pathology. Electronically Signed   By: Jerilynn Mages.  Shick M.D.   On: 11/08/2019 14:54   DG Knee Complete 4 Views Left  Result Date: 11/07/2019 CLINICAL DATA:  Pain.  No trauma history submitted. EXAM: LEFT KNEE - COMPLETE 4+ VIEW COMPARISON:  None. FINDINGS: No acute fracture or dislocation. No joint effusion. Moderate medial compartment joint space narrowing and osteophyte  formation. Enthesophytes at the quadriceps insertion and patellar tendon origin. Subtle chondrocalcinosis within the menisci. IMPRESSION: Degenerative change, without acute osseous finding. Chondrocalcinosis within the menisci, consistent with calcium pyrophosphate deposition disease. Electronically Signed   By: Abigail Miyamoto M.D.   On: 11/07/2019 19:21

## 2019-12-03 ENCOUNTER — Other Ambulatory Visit: Payer: Self-pay | Admitting: Hematology and Oncology

## 2019-12-03 DIAGNOSIS — M6289 Other specified disorders of muscle: Secondary | ICD-10-CM

## 2019-12-03 NOTE — Progress Notes (Signed)
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 CT abd/pelvis showed 10.8 cm transverse by 8.2 cm AP by 13.2 cm craniocaudad heterogenous complex cystic mass within the left iliacus and iliopsoas muscle 2) 11/08/2019: CT guided biopsy and aspiration were performed with pathology revealing "high grade malignancy".  3) 11/20/2019: establish care with Dr. Lorenso Courier   Interval History:  Joshua Pittman 79 y.o. male with medical history significant for a newly diagnosed left iliopsoas muscle sarcoma  presents for a follow up visit. The patient's last visit was on 11/20/2019 at which time he established care. In the interim since the last visit the patient has established care with Dr. Clovis Riley, a surgical oncologist, at [facility]. Additionally due to poor pain control the patient required a visit with symptom management at [facility] on 11/26/2019.  On exam today Joshua Pittman continues to be in pain.  He reports that it is "unbearable" whenever he attempts to move.  While he is sitting at rest the pain is about a 6 out of 10 in severity.  He has been taking his p.o. pain medications as prescribed with little in the way of relief.  The wife reports that he has been eating poorly and in the last 2 days has only had one meal and has been taking in the rest of his nutrition in the form of liquid.  The patient is unable to transfer between car and chair and requires full assist in lifting and putting in the chair.  He notes that he is also sleeping on a sofa as this is the most comfortable way for him to rest.  He is not having any issues with nausea, vomiting, or diarrhea.  His bowels have been moving well and he has not been having constipation  from his high pain medications.  Overall pain has been his primary concern.  A full 10 point ROS is listed below.  MEDICAL HISTORY:  Past Medical History:  Diagnosis Date  . Arrhythmia   . Prostate cancer ([place]) 02/18/14   gleason 8    SURGICAL HISTORY: Past Surgical History:  Procedure Laterality Date  . INGUINAL HERNIA REPAIR     x 2  . LYMPHADENECTOMY Bilateral 04/14/2014   Procedure: LYMPHADENECTOMY;  Surgeon: [name], MD;  Location: [facility];  Service: Urology;  Laterality: Bilateral;  . PROSTATE BIOPSY  02/18/14   gleason 8, vol 50 cc  . ROBOT ASSISTED LAPAROSCOPIC RADICAL PROSTATECTOMY N/A 04/14/2014   Procedure: ROBOTIC ASSISTED LAPAROSCOPIC RADICAL PROSTATECTOMY LEVEL 3;  Surgeon: [name], MD;  Location: [facility];  Service: Urology;  Laterality: N/A;    SOCIAL HISTORY: Social History   Socioeconomic History  . Marital status: Married    Spouse name: Not on file  . Number of children: Not on file  . Years of education: Not on file  . Highest education level: Not on file  Occupational History  . Not on file  Tobacco Use  . Smoking status: Current Every Day Smoker    Packs/day: 0.25    Years: 40.00    Pack years: 10.00    Types: Cigarettes  . Smokeless tobacco: Never Used  . Tobacco comment: 3-4 cigs daily  Substance and Sexual Activity  . Alcohol use: Yes    Comment:  beer socially on the weekends  . Drug use: No  . Sexual activity: Yes  Other Topics Concern  . Not on file  Social History Narrative  . Not on file   Social Determinants of Health   Financial Resource Strain:   . Difficulty of Paying Living Expenses:   Food Insecurity:   . Worried About Charity fundraiser in the Last Year:   . Arboriculturist in the Last Year:   Transportation Needs:   . Film/video editor (Medical):   Marland Kitchen Lack of Transportation (Non-Medical):   Physical Activity:   . Days of Exercise per Week:   . Minutes of Exercise per Session:   Stress:   . Feeling of  Stress :   Social Connections:   . Frequency of Communication with Friends and Family:   . Frequency of Social Gatherings with Friends and Family:   . Attends Religious Services:   . Active Member of Clubs or Organizations:   . Attends Archivist Meetings:   Marland Kitchen Marital Status:   Intimate Partner Violence:   . Fear of Current or Ex-Partner:   . Emotionally Abused:   Marland Kitchen Physically Abused:   . Sexually Abused:     FAMILY HISTORY: Family History  Problem Relation Age of Onset  . Asthma Mother   . Cancer Neg Hx     ALLERGIES:  has No Known Allergies.  MEDICATIONS:  Current Outpatient Medications  Medication Sig Dispense Refill  . amLODipine (NORVASC) 10 MG tablet Take 1 tablet (10 mg total) by mouth daily. 30 tablet 1  . DULoxetine (CYMBALTA) 20 MG capsule Take 1 capsule (20 mg total) by mouth daily. 30 capsule 3  . losartan (COZAAR) 25 MG tablet Take 1 tablet (25 mg total) by mouth daily. 30 tablet 1  . meloxicam (MOBIC) 7.5 MG tablet Take 1 tablet (7.5 mg total) by mouth daily as needed for pain. Take 1 tablet daily for hip pain. 30 tablet 0  . metFORMIN (GLUCOPHAGE XR) 500 MG 24 hr tablet Take 1 tablet (500 mg total) by mouth daily with breakfast. 30 tablet 1  . oxyCODONE (OXY IR/ROXICODONE) 5 MG immediate release tablet Take 1-2 tablets (5-10 mg total) by mouth every 4 (four) hours as needed for severe pain. 60 tablet 0  . oxyCODONE (OXYCONTIN) 15 mg 12 hr tablet Take 2 tablets (30 mg total) by mouth every 12 (twelve) hours for 14 days. 56 tablet 0   No current facility-administered medications for this visit.    REVIEW OF SYSTEMS:   Constitutional: ( - ) fevers, ( - )  chills , ( - ) night sweats Eyes: ( - ) blurriness of vision, ( - ) double vision, ( - ) watery eyes Ears, nose, mouth, throat, and face: ( - ) mucositis, ( - ) sore throat Respiratory: ( - ) cough, ( - ) dyspnea, ( - ) wheezes Cardiovascular: ( - ) palpitation, ( - ) chest discomfort, ( - ) lower  extremity swelling Gastrointestinal:  ( - ) nausea, ( - ) heartburn, ( - ) change in bowel habits Skin: ( - ) abnormal skin rashes Lymphatics: ( - ) new lymphadenopathy, ( - ) easy bruising Neurological: ( - ) numbness, ( - ) tingling, ( - ) new weaknesses Behavioral/Psych: ( - ) mood change, ( - ) new changes  All other systems were reviewed with the patient and are negative.  PHYSICAL EXAMINATION: ECOG PERFORMANCE STATUS: 2 - Symptomatic, <50%  confined to bed  Vitals:   12/04/19 1144  BP: 133/67  Pulse: 95  Resp: 18  Temp: 98.1 F (36.7 C)  SpO2: 95%   Filed Weights    GENERAL: chronically ill appearing elderly [place] male in NAD  SKIN: skin color, texture, turgor are normal, no rashes or significant lesions EYES: conjunctiva are pink and non-injected, sclera clear LUNGS: clear to auscultation and percussion with normal breathing effort HEART: regular rate & rhythm and no murmurs and no lower extremity edema Musculoskeletal: no cyanosis of digits and no clubbing  PSYCH: alert & oriented x 3, fluent speech NEURO: no focal motor/sensory deficits  LABORATORY DATA:  I have reviewed the data as listed CBC Latest Ref Rng & Units 12/04/2019 11/07/2019 10/27/2019  WBC 4.0 - 10.5 K/uL 12.2(H) 8.8 8.0  Hemoglobin 13.0 - 17.0 g/dL 13.1 13.3 13.1  Hematocrit 39 - 52 % 38.7(L) 39.5 39.1  Platelets 150 - 400 K/uL 509(H) 360 326    CMP Latest Ref Rng & Units 12/04/2019 11/07/2019 10/27/2019  Glucose 70 - 99 mg/dL 299(H) 147(H) 237(H)  BUN 8 - 23 mg/dL 17 15 17   Creatinine 0.61 - 1.24 mg/dL 0.90 0.76 0.81  Sodium 135 - 145 mmol/L 132(L) 133(L) 133(L)  Potassium 3.5 - 5.1 mmol/L 4.8 4.3 4.4  Chloride 98 - 111 mmol/L 95(L) 97(L) 99  CO2 22 - 32 mmol/L 28 26 27   Calcium 8.9 - 10.3 mg/dL 10.2 9.3 9.0  Total Protein 6.5 - 8.1 g/dL 7.0 - 6.8  Total Bilirubin 0.3 - 1.2 mg/dL 0.4 - 0.6  Alkaline Phos 38 - 126 U/L 83 - 85  AST 15 - 41 U/L 16 - 16  ALT 0 - 44 U/L 16 - 13    RADIOGRAPHIC  STUDIES: I have personally reviewed the radiological images as listed and agreed with the findings in the report: large mass in the left iliopsoas muscle. No other evidence of metastatic disease.   CT ABDOMEN PELVIS W CONTRAST  Result Date: 11/07/2019 CLINICAL DATA:  Right abdominal and hip pain, stage IV prostate cancer EXAM: CT ABDOMEN AND PELVIS WITH CONTRAST TECHNIQUE: Multidetector CT imaging of the abdomen and pelvis was performed using the standard protocol following bolus administration of intravenous contrast. CONTRAST:  159mL OMNIPAQUE IOHEXOL 300 MG/ML  SOLN COMPARISON:  CT 07/26/2017 FINDINGS: Lower chest: Lung bases demonstrate no acute consolidation or pleural effusion. Hepatobiliary: No focal liver abnormality is seen. No gallstones, gallbladder wall thickening, or biliary dilatation. Pancreas: Unremarkable. No pancreatic ductal dilatation or surrounding inflammatory changes. Spleen: Normal in size without focal abnormality. Adrenals/Urinary Tract: Adrenal glands are normal. Kidneys show no hydronephrosis. The urinary bladder is unremarkable Stomach/Bowel: Stomach is nonenlarged. No dilated small bowel. No bowel wall thickening. Negative appendix. Vascular/Lymphatic: Moderate aortic atherosclerosis without aneurysm. No suspicious adenopathy. Reproductive: Status post prostatectomy. Other: Negative for pelvic effusion or free air. Musculoskeletal: Large heterogeneous complex cystic mass within the left iliacus and iliopsoas muscle. There is mass effect on and displacement of the left psoas major muscle. The mass measures approximately 10.8 cm transverse by 8.2 cm AP by 13.2 cm craniocaudad. Mass appears to contain internal thick septations and possible soft tissue components. There is no underlying bony destructive change. Lucency and sclerosis of the right pelvis with slight bony expansion suggesting pagetoid changes. 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 No fracture is seen. IMPRESSION: 1. Large heterogenous complex cystic mass within the left iliacus and iliopsoas  muscle. Differential considerations include organizing/chronic hematoma, neoplasm, and abscess, though not much surrounding inflammatory change. Aspiration/tissue sampling may be considered for further evaluation. 2. 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 No fracture is seen Electronically Signed   By: Donavan Foil M.D.   On: 11/07/2019 23:23   CT ASPIRATION  Result Date: 11/08/2019 INDICATION: Left hip and leg pain, left pelvic sidewall complex large partially cystic mass EXAM: CT-GUIDED ASPIRATION AND CORE BIOPSY OF THE LEFT PELVIC SIDEWALL MASS MEDICATIONS: 1% LIDOCAINE LOCAL ANESTHESIA/SEDATION: Moderate (conscious) sedation was employed during this procedure. A total of Versed 4.0 mg and Fentanyl 100 mcg was administered intravenously. Moderate Sedation Time: 10 minutes. The patient's level of consciousness and vital signs were monitored continuously by radiology nursing throughout the procedure under my direct supervision. FLUOROSCOPY TIME:  Fluoroscopy Time: NONE. COMPLICATIONS: None immediate. PROCEDURE: Informed written consent was obtained from the patient after a thorough discussion of the procedural risks, benefits and alternatives. All questions were addressed. Maximal Sterile Barrier Technique was utilized including caps, mask, sterile gowns, sterile gloves, sterile drape, hand hygiene and skin antiseptic. A timeout was performed prior to the initiation of the procedure. Previous imaging reviewed. Patient positioned supine. Noncontrast localization CT performed. The large left pelvic sidewall complex mass was localized and marked for an anterolateral approach. Under sterile conditions and local anesthesia, distally an 18 gauge needle was advanced to the central aspect of the lesion.  Syringe aspiration performed. Small amount of brown serosanguineous fluid obtained. No purulence sample. Sample was sent for cytology and culture. Next, a 69 gauge coaxial biopsy needle was advanced into the more peripheral anterior aspect of the lesion along the solid component. Needle position confirmed with CT. 18 gauge core biopsies obtained. These were placed in formalin. Needle removed. Postprocedure imaging demonstrates no hemorrhage or hematoma. Patient tolerated biopsy well. Samples were intact and non fragmented. IMPRESSION: Successful CT-guided aspiration of the left pelvic sidewall complex partially cystic mass for cytology and culture Successful CT-guided core biopsy of the left pelvic sidewall complex masses well. Sent for pathology. Electronically Signed   By: Jerilynn Mages.  Shick M.D.   On: 11/08/2019 14:54   CT BIOPSY  Result Date: 11/08/2019 INDICATION: Left hip and leg pain, left pelvic sidewall complex large partially cystic mass EXAM: CT-GUIDED ASPIRATION AND CORE BIOPSY OF THE LEFT PELVIC SIDEWALL MASS MEDICATIONS: 1% LIDOCAINE LOCAL ANESTHESIA/SEDATION: Moderate (conscious) sedation was employed during this procedure. A total of Versed 4.0 mg and Fentanyl 100 mcg was administered intravenously. Moderate Sedation Time: 10 minutes. The patient's level of consciousness and vital signs were monitored continuously by radiology nursing throughout the procedure under my direct supervision. FLUOROSCOPY TIME:  Fluoroscopy Time: NONE. COMPLICATIONS: None immediate. PROCEDURE: Informed written consent was obtained from the patient after a thorough discussion of the procedural risks, benefits and alternatives. All questions were addressed. Maximal Sterile Barrier Technique was utilized including caps, mask, sterile gowns, sterile gloves, sterile drape, hand hygiene and skin antiseptic. A timeout was performed prior to the initiation of the procedure. Previous imaging reviewed. Patient positioned supine.  Noncontrast localization CT performed. The large left pelvic sidewall complex mass was localized and marked for an anterolateral approach. Under sterile conditions and local anesthesia, distally an 18 gauge needle was advanced to the central aspect of the lesion. Syringe aspiration performed. Small amount of brown serosanguineous fluid obtained. No purulence sample. Sample was sent for cytology and culture. Next, a 14 gauge coaxial biopsy needle was  advanced into the more peripheral anterior aspect of the lesion along the solid component. Needle position confirmed with CT. 18 gauge core biopsies obtained. These were placed in formalin. Needle removed. Postprocedure imaging demonstrates no hemorrhage or hematoma. Patient tolerated biopsy well. Samples were intact and non fragmented. IMPRESSION: Successful CT-guided aspiration of the left pelvic sidewall complex partially cystic mass for cytology and culture Successful CT-guided core biopsy of the left pelvic sidewall complex masses well. Sent for pathology. Electronically Signed   By: Jerilynn Mages.  Shick M.D.   On: 11/08/2019 14:54   DG Knee Complete 4 Views Left  Result Date: 11/07/2019 CLINICAL DATA:  Pain.  No trauma history submitted. EXAM: LEFT KNEE - COMPLETE 4+ VIEW COMPARISON:  None. FINDINGS: No acute fracture or dislocation. No joint effusion. Moderate medial compartment joint space narrowing and osteophyte formation. Enthesophytes at the quadriceps insertion and patellar tendon origin. Subtle chondrocalcinosis within the menisci. IMPRESSION: Degenerative change, without acute osseous finding. Chondrocalcinosis within the menisci, consistent with calcium pyrophosphate deposition disease. Electronically Signed   By: Abigail Miyamoto M.D.   On: 11/07/2019 19:21    ASSESSMENT & PLAN Joshua Pittman 79 y.o. male with medical history significant for a newly diagnosed left iliopsoas muscle sarcoma  presents for a follow up visit.  In the interim since his last  visit the patient's pathology was sent to [facility] for further review, however this time we only have preliminary results consistent with a liposarcoma and further tests are pending.  We have been informed that the test should be complete by early next week.  Also the patient has an appointment scheduled with oncological surgical specialty on 12/06/2019.  We will forward to their evaluation in determining whether or not a definitive or palliative resection could be considered.  Given his extreme pain I would like him to be considered for symptomatic debulking of the tumor.  At this time we will continue to focus on pain control and await the final results of pathology.  The treatment modalities moving forward we will have to wait until we have final surgical pathology in order to determine the best course of action.  The patient and his family voiced understanding of the findings today.  We will plan to have him come back in 2 weeks time for further pain control and evaluation.  # Iliopsoas Muscle Sarcoma, evaluation underway -- we have requested the pathology have this reviewed by a specialist in sarcoma. The tissue is being sent to a specialist [place] for review. 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  --based on the imaging I am unsure if this sarcoma would be amenable to resection. To be sure I would like a surgical evaluation. I would recommend the patient be evaluated by a Oncological surgeon specialist. He is currently scheduled to see Dr. Clovis Riley at [facility] on 12/06/2019. Also would want patient to be considered for a palliative debulking for pain control.  --unclear at this time if treatment will need to include chemotherapy vs radiation. Will hold on port placement until we know the final pathology of this tumor --discussed the likely treatments moving forward with the patient and his family. Noted that treatment decisions moving forward  would depend on the final pathology  --RTC 2 weeks  #Pain Control --patient has been taking his medication with little relief --increase oxycontin to 30mg  q12H with oxycodone 5-10mg  q4H PRN --we can consider the addition of acetaminophen/ibuprofen to help better control the pain.  --referral to  oncological surgery. Request that they consider the patient for palliative resection if definitive resection is not a viable option.  --continue to monitor closely.   No orders of the defined types were placed in this encounter.   All questions were answered. The patient knows to call the clinic with any problems, questions or concerns.  A total of more than 30 minutes were spent on this encounter and over half of that time was spent on counseling and coordination of care as outlined above.   Ledell Peoples, MD Department of Hematology/Oncology [place] at [facility] Phone: [phone] Pager: [phone] Email: [email or web address]  12/04/2019 1:21 PM

## 2019-12-04 ENCOUNTER — Encounter: Payer: Self-pay | Admitting: *Deleted

## 2019-12-04 ENCOUNTER — Inpatient Hospital Stay: Payer: Medicaid Other

## 2019-12-04 ENCOUNTER — Encounter: Payer: Self-pay | Admitting: Hematology and Oncology

## 2019-12-04 ENCOUNTER — Inpatient Hospital Stay (HOSPITAL_BASED_OUTPATIENT_CLINIC_OR_DEPARTMENT_OTHER): Payer: Medicaid Other | Admitting: Hematology and Oncology

## 2019-12-04 ENCOUNTER — Other Ambulatory Visit: Payer: Self-pay

## 2019-12-04 VITALS — BP 133/67 | HR 95 | Temp 98.1°F | Resp 18 | Ht 61.0 in

## 2019-12-04 DIAGNOSIS — M79652 Pain in left thigh: Secondary | ICD-10-CM | POA: Diagnosis not present

## 2019-12-04 DIAGNOSIS — C495 Malignant neoplasm of connective and soft tissue of pelvis: Secondary | ICD-10-CM | POA: Diagnosis not present

## 2019-12-04 DIAGNOSIS — M6289 Other specified disorders of muscle: Secondary | ICD-10-CM

## 2019-12-04 DIAGNOSIS — R52 Pain, unspecified: Secondary | ICD-10-CM

## 2019-12-04 LAB — CBC WITH DIFFERENTIAL (CANCER CENTER ONLY)
Abs Immature Granulocytes: 0.07 10*3/uL (ref 0.00–0.07)
Basophils Absolute: 0 10*3/uL (ref 0.0–0.1)
Basophils Relative: 0 %
Eosinophils Absolute: 0 10*3/uL (ref 0.0–0.5)
Eosinophils Relative: 0 %
HCT: 38.7 % — ABNORMAL LOW (ref 39.0–52.0)
Hemoglobin: 13.1 g/dL (ref 13.0–17.0)
Immature Granulocytes: 1 %
Lymphocytes Relative: 7 %
Lymphs Abs: 0.8 10*3/uL (ref 0.7–4.0)
MCH: 30.3 pg (ref 26.0–34.0)
MCHC: 33.9 g/dL (ref 30.0–36.0)
MCV: 89.4 fL (ref 80.0–100.0)
Monocytes Absolute: 1.2 10*3/uL — ABNORMAL HIGH (ref 0.1–1.0)
Monocytes Relative: 10 %
Neutro Abs: 10.1 10*3/uL — ABNORMAL HIGH (ref 1.7–7.7)
Neutrophils Relative %: 82 %
Platelet Count: 509 10*3/uL — ABNORMAL HIGH (ref 150–400)
RBC: 4.33 MIL/uL (ref 4.22–5.81)
RDW: 11.9 % (ref 11.5–15.5)
WBC Count: 12.2 10*3/uL — ABNORMAL HIGH (ref 4.0–10.5)
nRBC: 0 % (ref 0.0–0.2)

## 2019-12-04 LAB — CMP (CANCER CENTER ONLY)
ALT: 16 U/L (ref 0–44)
AST: 16 U/L (ref 15–41)
Albumin: 3.1 g/dL — ABNORMAL LOW (ref 3.5–5.0)
Alkaline Phosphatase: 83 U/L (ref 38–126)
Anion gap: 9 (ref 5–15)
BUN: 17 mg/dL (ref 8–23)
CO2: 28 mmol/L (ref 22–32)
Calcium: 10.2 mg/dL (ref 8.9–10.3)
Chloride: 95 mmol/L — ABNORMAL LOW (ref 98–111)
Creatinine: 0.9 mg/dL (ref 0.61–1.24)
GFR, Est AFR Am: >60 mL/min (ref >60)
GFR, Estimated: >60 mL/min (ref >60)
Glucose, Bld: 299 mg/dL — ABNORMAL HIGH (ref 70–99)
Potassium: 4.8 mmol/L (ref 3.5–5.1)
Sodium: 132 mmol/L — ABNORMAL LOW (ref 135–145)
Total Bilirubin: 0.4 mg/dL (ref 0.3–1.2)
Total Protein: 7 g/dL (ref 6.5–8.1)

## 2019-12-04 MED ORDER — OXYCODONE HCL ER 15 MG PO T12A: 30.0000 mg | EXTENDED_RELEASE_TABLET | Freq: Two times a day (BID) | ORAL | 0 refills | Status: AC

## 2019-12-04 MED ORDER — OXYCODONE HCL 5 MG PO TABS: 5.0000 mg | ORAL_TABLET | ORAL | 0 refills | Status: AC | PRN

## 2019-12-06 ENCOUNTER — Telehealth: Payer: Self-pay | Admitting: Hematology and Oncology

## 2019-12-06 ENCOUNTER — Telehealth: Payer: Self-pay | Admitting: *Deleted

## 2019-12-06 NOTE — Telephone Encounter (Signed)
Scheduled per los. Called with interpreter. Left msg. Mailed printout  

## 2019-12-06 NOTE — Telephone Encounter (Signed)
Received call from [place] for this patient for Medicaid.  She is calling as pt /pt family has requested some DME including hospital bed,shower seat with a back, adult diapers and incontinent pads.  They are also requesting PCS (personal care services) Both of these requests were fax'd: DME to [employer] and PCS to [facility].

## 2019-12-09 ENCOUNTER — Other Ambulatory Visit: Payer: Self-pay | Admitting: *Deleted

## 2019-12-09 DIAGNOSIS — M6289 Other specified disorders of muscle: Secondary | ICD-10-CM

## 2019-12-13 NOTE — Progress Notes (Signed)
Addenedum 12/13/19 for DME order of 12/06/19: Pt Requires frequest re-positioning of the body in ways that cannot be achieved with an ordinary bed or wedge pillow for relief of pain, prevent pressure sores. Pt has a large tumor of his left iliopsoas muscle and is now non-ambulatory due to pain. He cannot use his own bed and does need frequent re-positioning. Pt needs assist for repositioning from his caregivers. Requesting semi-electric hospital bed, over bed table and incontinent supplies Drucie Ip, RN  With Dr. Lorenso Courier

## 2019-12-16 ENCOUNTER — Encounter (HOSPITAL_COMMUNITY): Payer: Self-pay | Admitting: Hematology and Oncology

## 2019-12-16 LAB — SURGICAL PATHOLOGY

## 2019-12-17 ENCOUNTER — Other Ambulatory Visit: Payer: Self-pay | Admitting: Hematology and Oncology

## 2019-12-17 DIAGNOSIS — C499 Malignant neoplasm of connective and soft tissue, unspecified: Secondary | ICD-10-CM

## 2019-12-17 NOTE — Progress Notes (Deleted)
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 CT abd/pelvis showed 10.8 cm transverse by 8.2 cm AP by 13.2 cm craniocaudad heterogenous complex cystic mass within the left iliacus and iliopsoas muscle 2) 11/08/2019: CT guided biopsy and aspiration were performed with pathology revealing "high grade malignancy".  3) 11/20/2019: establish care with Dr. Lorenso Courier   Interval History:  Joshua Pittman 79 y.o. male with medical history significant for a newly diagnosed left iliopsoas muscle sarcoma  presents for a follow up visit. The patient's last visit was on 11/20/2019 at which time he established care. In the interim since the last visit the patient has established care with Dr. Clovis Riley, a surgical oncologist, at [facility]. Additionally due to poor pain control the patient required a visit with symptom management at [facility] on 11/26/2019.  On exam today Joshua Pittman continues to be in pain.  He reports that it is "unbearable" whenever he attempts to move.  While he is sitting at rest the pain is about a 6 out of 10 in severity.  He has been taking his p.o. pain medications as prescribed with little in the way of relief.  The wife reports that he has been eating poorly and in the last 2 days has only had one meal and has been taking in the rest of his nutrition in the form of liquid.  The patient is unable to transfer between car and chair and requires full assist in lifting and putting in the chair.  He notes that he is also sleeping on a sofa as this is the most comfortable way for him to rest.  He is not having any issues with nausea, vomiting, or diarrhea.  His bowels have been moving well and he has not been having constipation  from his high pain medications.  Overall pain has been his primary concern.  A full 10 point ROS is listed below.  MEDICAL HISTORY:  Past Medical History:  Diagnosis Date  . Arrhythmia   . Prostate cancer ([place]) 02/18/14   gleason 8    SURGICAL HISTORY: Past Surgical History:  Procedure Laterality Date  . INGUINAL HERNIA REPAIR     x 2  . LYMPHADENECTOMY Bilateral 04/14/2014   Procedure: LYMPHADENECTOMY;  Surgeon: [name], MD;  Location: [facility];  Service: Urology;  Laterality: Bilateral;  . PROSTATE BIOPSY  02/18/14   gleason 8, vol 50 cc  . ROBOT ASSISTED LAPAROSCOPIC RADICAL PROSTATECTOMY N/A 04/14/2014   Procedure: ROBOTIC ASSISTED LAPAROSCOPIC RADICAL PROSTATECTOMY LEVEL 3;  Surgeon: [name], MD;  Location: [facility];  Service: Urology;  Laterality: N/A;    SOCIAL HISTORY: Social History   Socioeconomic History  . Marital status: Married    Spouse name: Not on file  . Number of children: Not on file  . Years of education: Not on file  . Highest education level: Not on file  Occupational History  . Not on file  Tobacco Use  . Smoking status: Current Every Day Smoker    Packs/day: 0.25    Years: 40.00    Pack years: 10.00    Types: Cigarettes  . Smokeless tobacco: Never Used  . Tobacco comment: 3-4 cigs daily  Substance and Sexual Activity  . Alcohol use: Yes    Comment:  beer socially on the weekends  . Drug use: No  . Sexual activity: Yes  Other Topics Concern  . Not on file  Social History Narrative  . Not on file   Social Determinants of Health   Financial Resource Strain:   . Difficulty of Paying Living Expenses:   Food Insecurity:   . Worried About Charity fundraiser in the Last Year:   . Arboriculturist in the Last Year:   Transportation Needs:   . Film/video editor (Medical):   Marland Kitchen Lack of Transportation (Non-Medical):   Physical Activity:   . Days of Exercise per Week:   . Minutes of Exercise per Session:   Stress:   . Feeling of  Stress :   Social Connections:   . Frequency of Communication with Friends and Family:   . Frequency of Social Gatherings with Friends and Family:   . Attends Religious Services:   . Active Member of Clubs or Organizations:   . Attends Archivist Meetings:   Marland Kitchen Marital Status:   Intimate Partner Violence:   . Fear of Current or Ex-Partner:   . Emotionally Abused:   Marland Kitchen Physically Abused:   . Sexually Abused:     FAMILY HISTORY: Family History  Problem Relation Age of Onset  . Asthma Mother   . Cancer Neg Hx     ALLERGIES:  has No Known Allergies.  MEDICATIONS:  Current Outpatient Medications  Medication Sig Dispense Refill  . amLODipine (NORVASC) 10 MG tablet Take 1 tablet (10 mg total) by mouth daily. 30 tablet 1  . DULoxetine (CYMBALTA) 20 MG capsule Take 1 capsule (20 mg total) by mouth daily. 30 capsule 3  . losartan (COZAAR) 25 MG tablet Take 1 tablet (25 mg total) by mouth daily. 30 tablet 1  . meloxicam (MOBIC) 7.5 MG tablet Take 1 tablet (7.5 mg total) by mouth daily as needed for pain. Take 1 tablet daily for hip pain. 30 tablet 0  . metFORMIN (GLUCOPHAGE XR) 500 MG 24 hr tablet Take 1 tablet (500 mg total) by mouth daily with breakfast. 30 tablet 1  . oxyCODONE (OXY IR/ROXICODONE) 5 MG immediate release tablet Take 1-2 tablets (5-10 mg total) by mouth every 4 (four) hours as needed for severe pain. 60 tablet 0  . oxyCODONE (OXYCONTIN) 15 mg 12 hr tablet Take 2 tablets (30 mg total) by mouth every 12 (twelve) hours for 14 days. 56 tablet 0   No current facility-administered medications for this visit.    REVIEW OF SYSTEMS:   Constitutional: ( - ) fevers, ( - )  chills , ( - ) night sweats Eyes: ( - ) blurriness of vision, ( - ) double vision, ( - ) watery eyes Ears, nose, mouth, throat, and face: ( - ) mucositis, ( - ) sore throat Respiratory: ( - ) cough, ( - ) dyspnea, ( - ) wheezes Cardiovascular: ( - ) palpitation, ( - ) chest discomfort, ( - ) lower  extremity swelling Gastrointestinal:  ( - ) nausea, ( - ) heartburn, ( - ) change in bowel habits Skin: ( - ) abnormal skin rashes Lymphatics: ( - ) new lymphadenopathy, ( - ) easy bruising Neurological: ( - ) numbness, ( - ) tingling, ( - ) new weaknesses Behavioral/Psych: ( - ) mood change, ( - ) new changes  All other systems were reviewed with the patient and are negative.  PHYSICAL EXAMINATION: ECOG PERFORMANCE STATUS: 2 - Symptomatic, <50%  confined to bed  There were no vitals filed for this visit. There were no vitals filed for this visit.  GENERAL: chronically ill appearing elderly [place] male in NAD  SKIN: skin color, texture, turgor are normal, no rashes or significant lesions EYES: conjunctiva are pink and non-injected, sclera clear LUNGS: clear to auscultation and percussion with normal breathing effort HEART: regular rate & rhythm and no murmurs and no lower extremity edema Musculoskeletal: no cyanosis of digits and no clubbing  PSYCH: alert & oriented x 3, fluent speech NEURO: no focal motor/sensory deficits  LABORATORY DATA:  I have reviewed the data as listed CBC Latest Ref Rng & Units 12/04/2019 11/07/2019 10/27/2019  WBC 4.0 - 10.5 K/uL 12.2(H) 8.8 8.0  Hemoglobin 13.0 - 17.0 g/dL 13.1 13.3 13.1  Hematocrit 39 - 52 % 38.7(L) 39.5 39.1  Platelets 150 - 400 K/uL 509(H) 360 326    CMP Latest Ref Rng & Units 12/04/2019 11/07/2019 10/27/2019  Glucose 70 - 99 mg/dL 299(H) 147(H) 237(H)  BUN 8 - 23 mg/dL 17 15 17   Creatinine 0.61 - 1.24 mg/dL 0.90 0.76 0.81  Sodium 135 - 145 mmol/L 132(L) 133(L) 133(L)  Potassium 3.5 - 5.1 mmol/L 4.8 4.3 4.4  Chloride 98 - 111 mmol/L 95(L) 97(L) 99  CO2 22 - 32 mmol/L 28 26 27   Calcium 8.9 - 10.3 mg/dL 10.2 9.3 9.0  Total Protein 6.5 - 8.1 g/dL 7.0 - 6.8  Total Bilirubin 0.3 - 1.2 mg/dL 0.4 - 0.6  Alkaline Phos 38 - 126 U/L 83 - 85  AST 15 - 41 U/L 16 - 16  ALT 0 - 44 U/L 16 - 13    RADIOGRAPHIC STUDIES: I have personally reviewed  the radiological images as listed and agreed with the findings in the report: large mass in the left iliopsoas muscle. No other evidence of metastatic disease.   No results found.  ASSESSMENT & PLAN Joshua Pittman 79 y.o. male with medical history significant for a newly diagnosed left iliopsoas muscle sarcoma  presents for a follow up visit.  In the interim since his last visit the patient has established care with Dr. Clovis Riley at [facility] and the Radiation oncology department there as well. ***   # Iliopsoas Muscle Sarcoma, Pleomorphic Malignant Spindle Cell Neoplasm.  -- we have requested the pathology have this reviewed by a specialist in sarcoma. The tissue is being sent to a specialist [place] for review. 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  --patient has established with Dr. Clovis Riley at [facility] on 12/06/2019. Also the patient has connected with radiation oncology at that site for neoadjuvant radiation therapy.  Surgery is currently planned for 12/19/2019.  --discussed the likely treatments moving forward with the patient and his family. Patient may require adjuvant chemotherapy treatment following resection. Consideration would include doxorubicin, epirubicin, and ifosfamide alone or in combination --RTC ***  #Pain Control --patient has been taking his medication with little relief --increase oxycontin to 30mg  q12H with oxycodone 5-10mg  q4H PRN --we can consider the addition of acetaminophen/ibuprofen to help better control the pain.  --referral to oncological surgery. Request that they consider the patient for palliative resection if definitive resection is not a viable option.  --continue to monitor closely.   No orders of the defined types were placed in this encounter.   All questions were answered. The patient knows to call the clinic with any problems, questions or concerns.  A total of more than 30 minutes were  spent on this encounter and over half of that time was spent on counseling and coordination of care as outlined above.   Ledell Peoples, MD Department of Hematology/Oncology [facility] Phone: [phone] Pager: [phone] Email: [email or web address]  12/17/2019 3:52 PM

## 2019-12-18 ENCOUNTER — Inpatient Hospital Stay: Payer: Medicaid Other | Admitting: Hematology and Oncology

## 2019-12-18 ENCOUNTER — Inpatient Hospital Stay: Payer: Medicaid Other

## 2019-12-24 ENCOUNTER — Encounter (HOSPITAL_COMMUNITY): Payer: Self-pay | Admitting: Hematology and Oncology

## 2020-01-19 DEATH — deceased

## 2021-11-24 IMAGING — CT CT ABD-PELV W/ CM
2 of 4 series · 16 of 46 positions shown, 18 images · IV contrast (omnipaque)
Comparison: CT 07/26/2017

CLINICAL DATA: Right abdominal and hip pain, stage IV prostate
cancer

EXAM:
CT ABDOMEN AND PELVIS WITH CONTRAST
TECHNIQUE: Multidetector CT imaging of the abdomen and pelvis was performed
using the standard protocol following bolus administration of
intravenous contrast.
CONTRAST:  100mL OMNIPAQUE IOHEXOL 300 MG/ML  SOLN

[Series 2: axial st · axial · 0.75mm/px · z∈[-694,-344]mm · 13 of 80 slices shown, 15 images]
[im 5/80  soft-tissue]
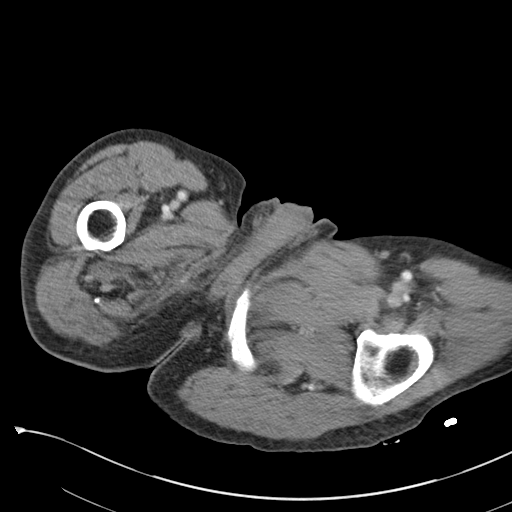
[im 5/80  bone]
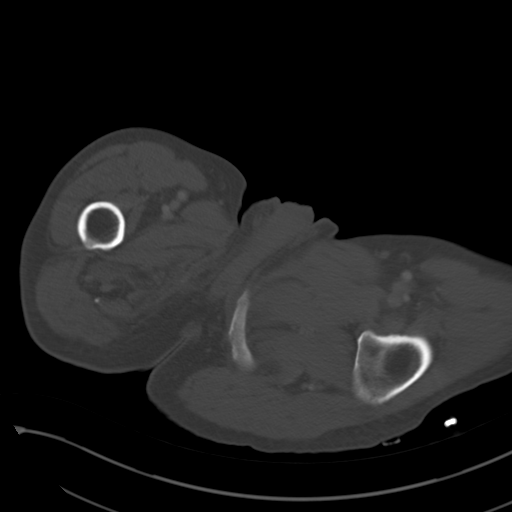
[im 13/80  soft-tissue]
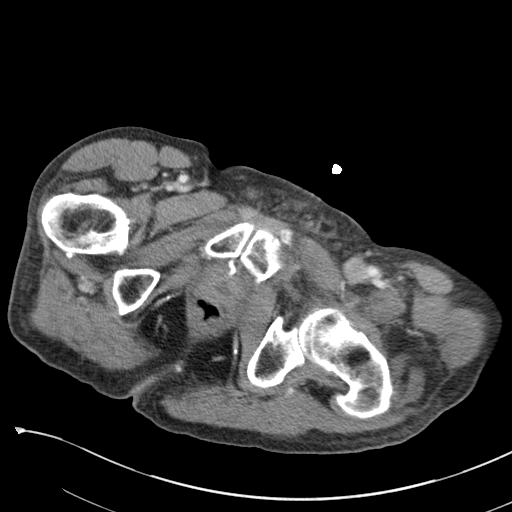
[im 17/80  soft-tissue]
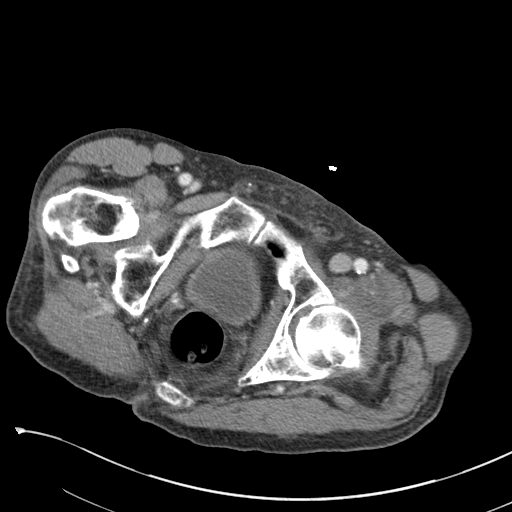
[im 21/80  soft-tissue]
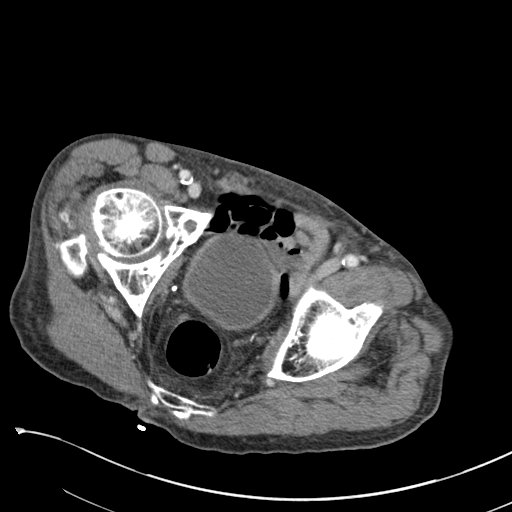
[im 30/80  soft-tissue]
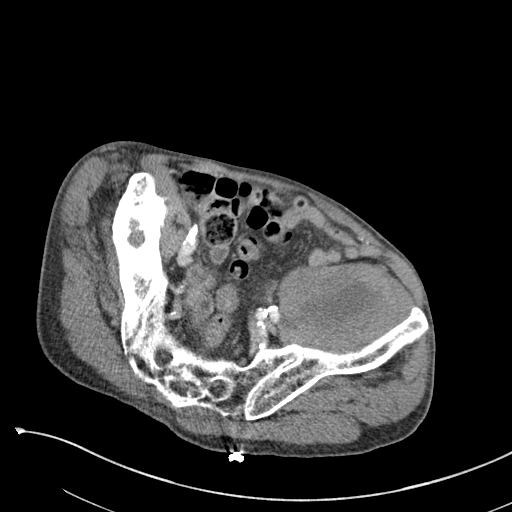
[im 34/80  soft-tissue]
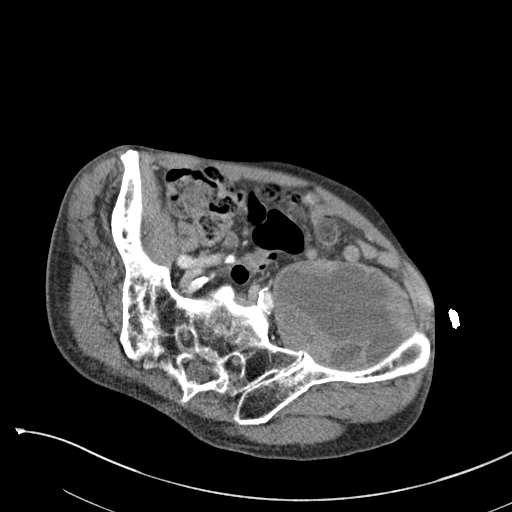
[im 42/80  soft-tissue]
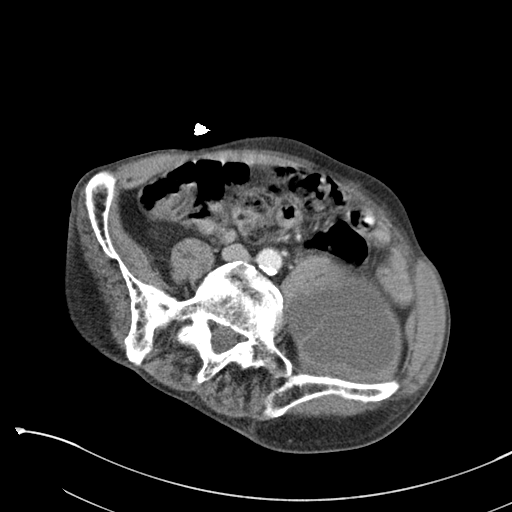
[im 46/80  soft-tissue]
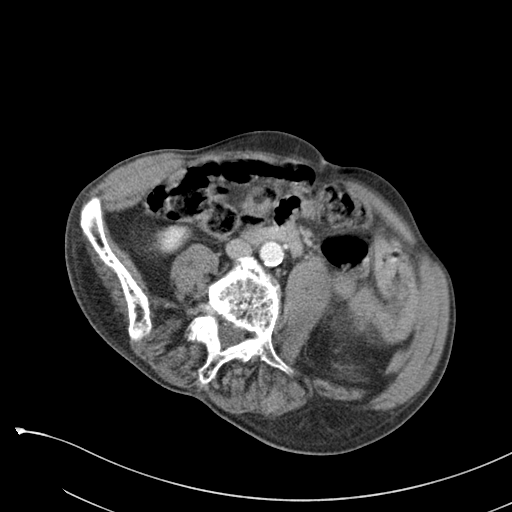
[im 50/80  soft-tissue]
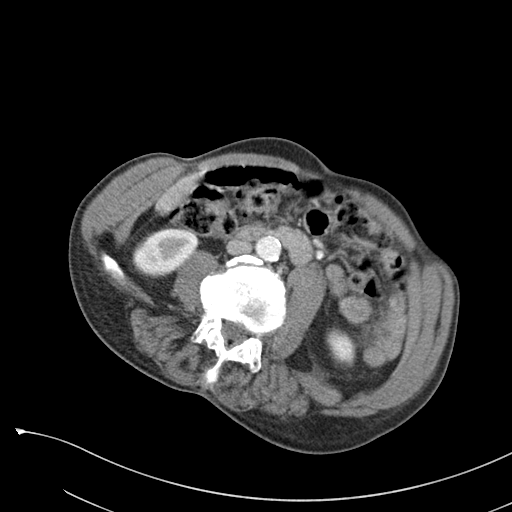
[im 50/80  bone]
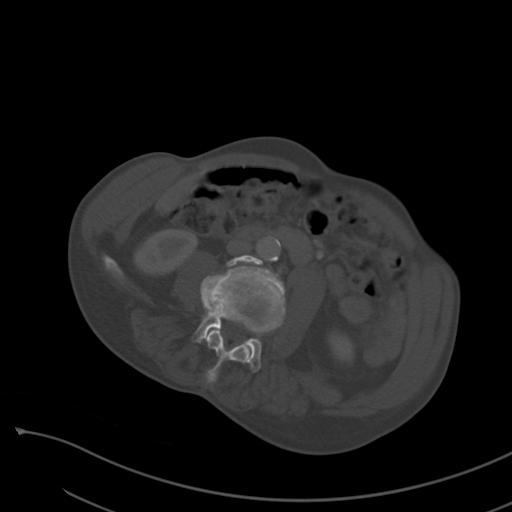
[im 59/80  soft-tissue]
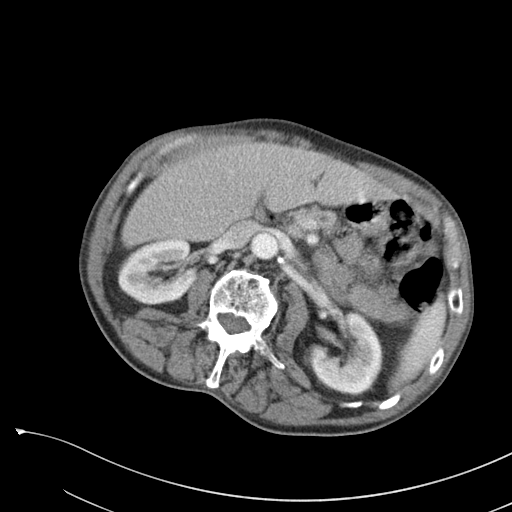
[im 63/80  soft-tissue]
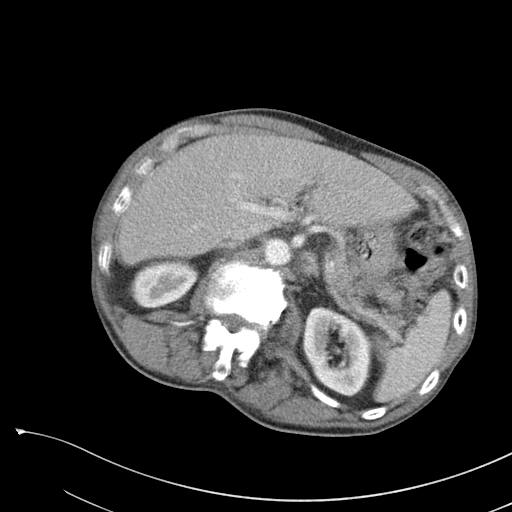
[im 67/80  soft-tissue]
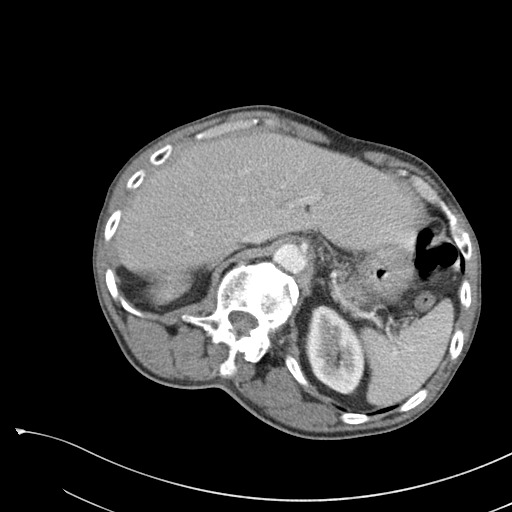
[im 75/80  soft-tissue]
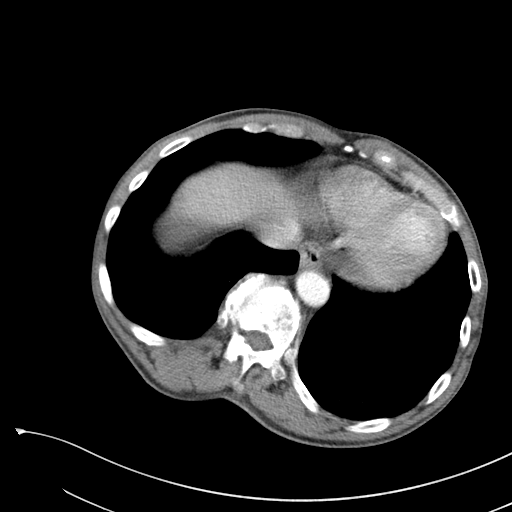

[Series 4: coronal st · coronal · 0.75mm/px · 3 of 127 slices shown]
[im 43/127  soft-tissue]
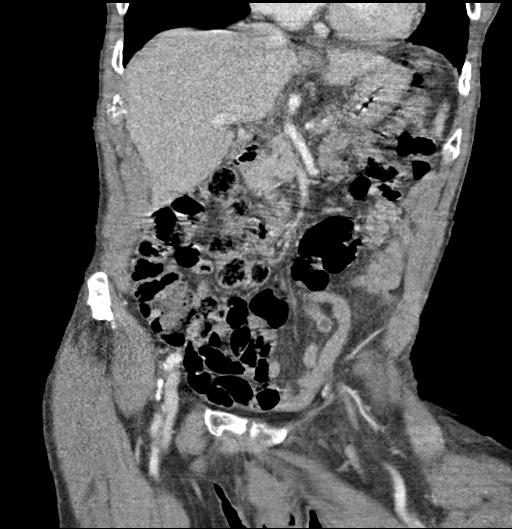
[im 57/127  soft-tissue]
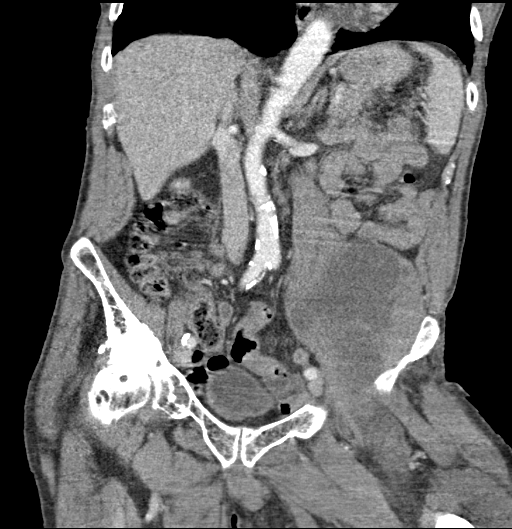
[im 71/127  soft-tissue]
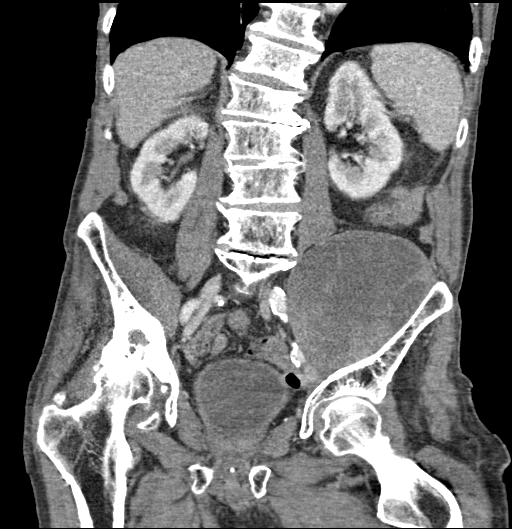

[16 of 46 positions shown; findings below may reference images not displayed]

FINDINGS: Lower chest: Lung bases demonstrate no acute consolidation or
pleural effusion.

Hepatobiliary: No focal liver abnormality is seen. No gallstones,
gallbladder wall thickening, or biliary dilatation.

Pancreas: Unremarkable. No pancreatic ductal dilatation or
surrounding inflammatory changes.

Spleen: Normal in size without focal abnormality.

Adrenals/Urinary Tract: Adrenal glands are normal. Kidneys show no
hydronephrosis. The urinary bladder is unremarkable

Stomach/Bowel: Stomach is nonenlarged. No dilated small bowel. No
bowel wall thickening. Negative appendix.

Vascular/Lymphatic: Moderate aortic atherosclerosis without
aneurysm. No suspicious adenopathy.

Reproductive: Status post prostatectomy.

Other: Negative for pelvic effusion or free air.

Musculoskeletal: Large heterogeneous complex cystic mass within the
left iliacus and iliopsoas muscle. There is mass effect on and
displacement of the left psoas major muscle. The mass measures
approximately 10.8 cm transverse by 8.2 cm AP by 13.2 cm
craniocaudad. Mass appears to contain internal thick septations and
possible soft tissue components. There is no underlying bony
destructive change.

Lucency and sclerosis of the right pelvis with slight bony expansion
suggesting pagetoid changes. Progression of severe arthritis of the
right hip with bone on bone appearance, subarticular geodes and
sclerosis. No fracture is seen.
IMPRESSION: 1. Large heterogenous complex cystic mass within the left iliacus
and iliopsoas muscle. Differential considerations include
organizing/chronic hematoma, neoplasm, and abscess, though not much
surrounding inflammatory change. Aspiration/tissue sampling may be
considered for further evaluation.
2. Suspected pagetoid changes of the right pelvis. Progression of
severe arthritic changes of the right hip. No fracture is seen

## 2021-11-25 IMAGING — CT CT GUIDANCE NEEDLE PLACEMENT
1 of 2 series · 15 of 32 positions shown, 19 images · non-contrast
Comparison: none

INDICATION: Left hip and leg pain, left pelvic sidewall complex large partially
cystic mass

[Series 2: i-spiral 5.0 b31f · axial · 0.98mm/px · z∈[+1108,+1388]mm · 15 of 91 slices shown, 19 images]
[im 7/91  soft-tissue]
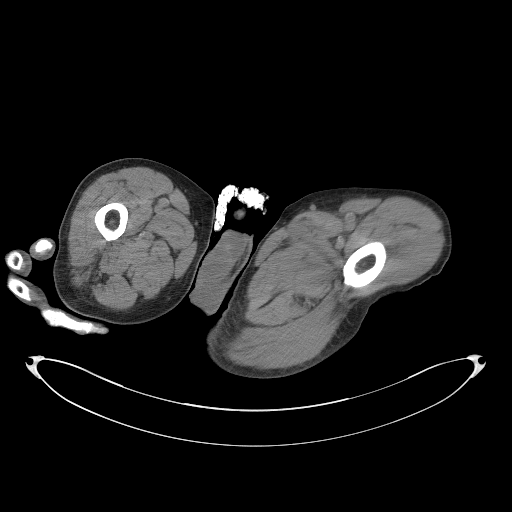
[im 7/91  bone]
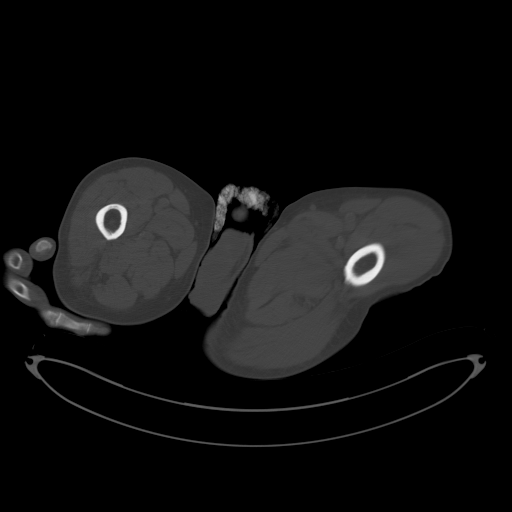
[im 14/91  soft-tissue]
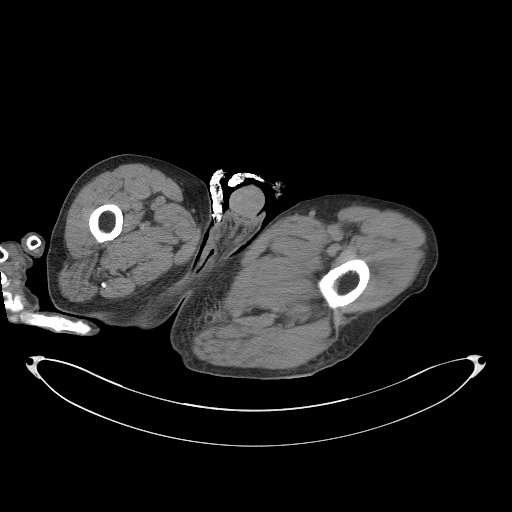
[im 21/91  soft-tissue]
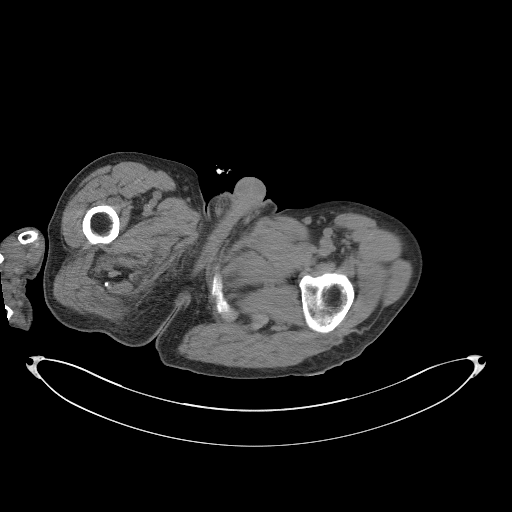
[im 27/91  soft-tissue]
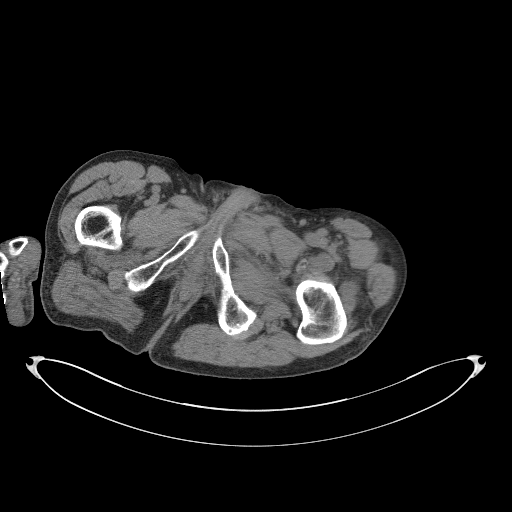
[im 34/91  soft-tissue]
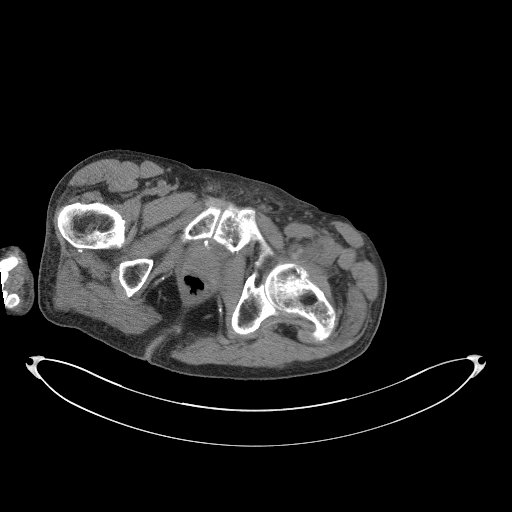
[im 41/91  soft-tissue]
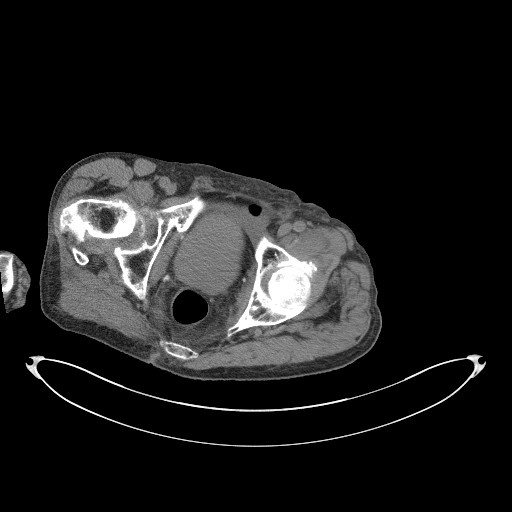
[im 47/91  soft-tissue]
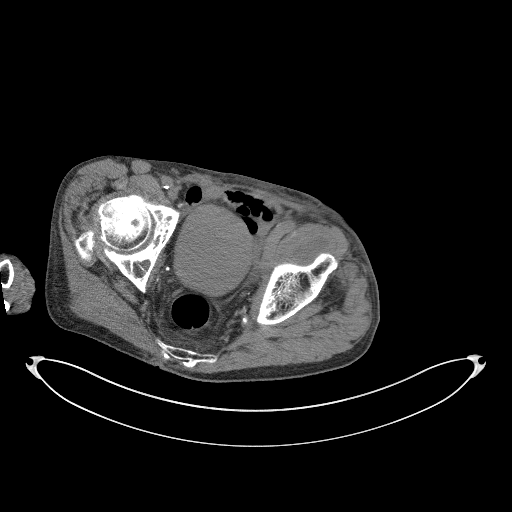
[im 54/91  soft-tissue]
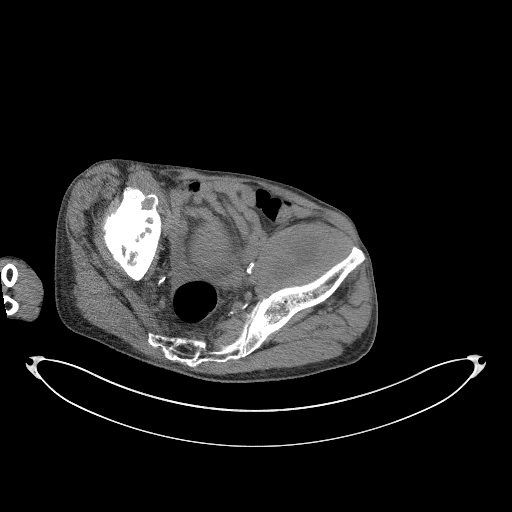
[im 61/91  soft-tissue]
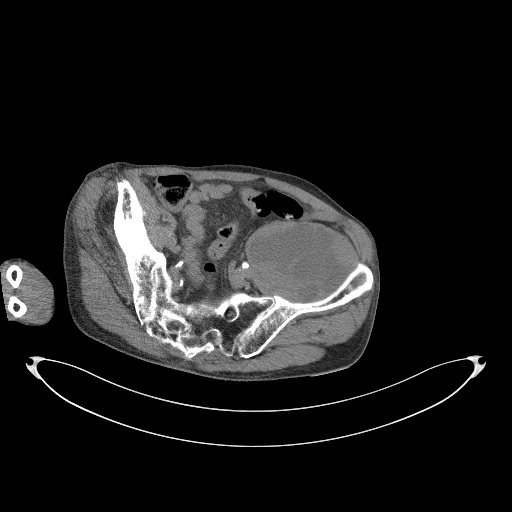
[im 61/91  bone]
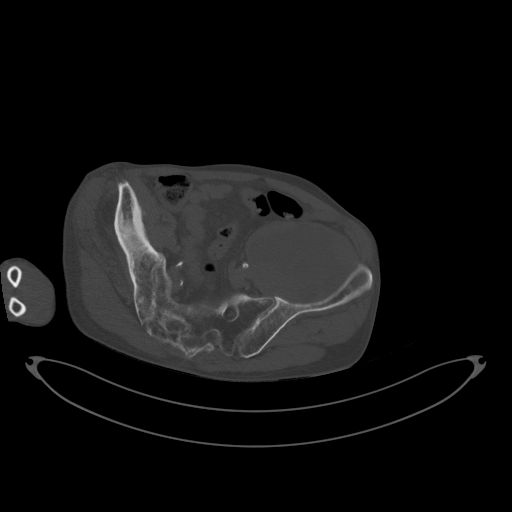
[im 67/91  soft-tissue]
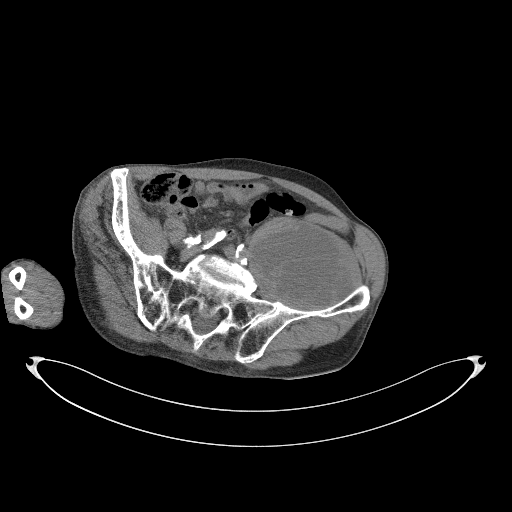
[im 74/91  soft-tissue]
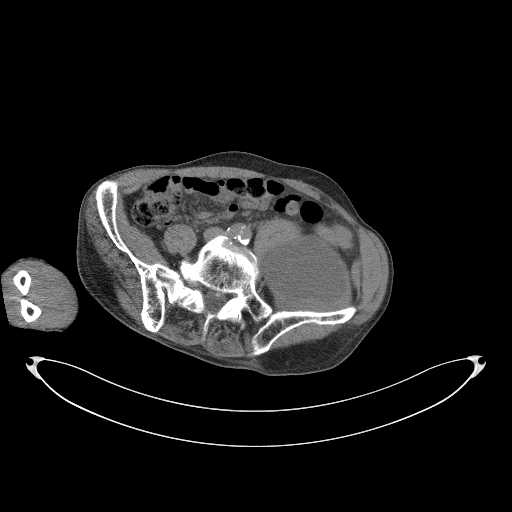
[im 77/91  lung]
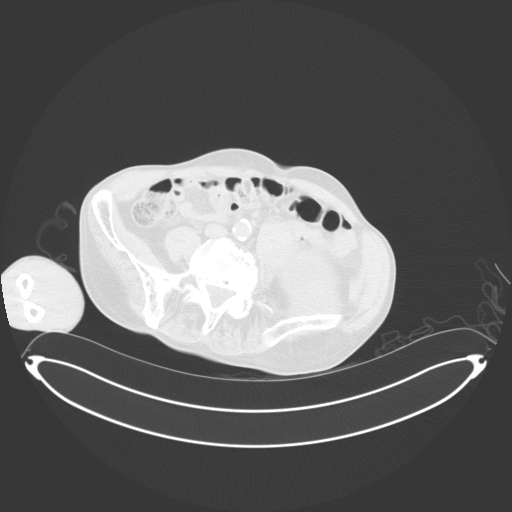
[im 81/91  soft-tissue]
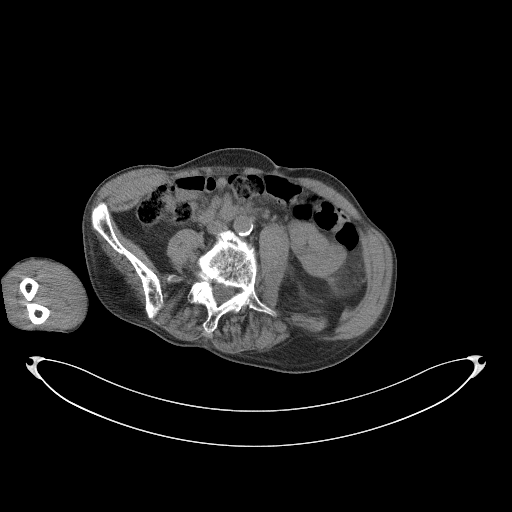
[im 81/91  lung]
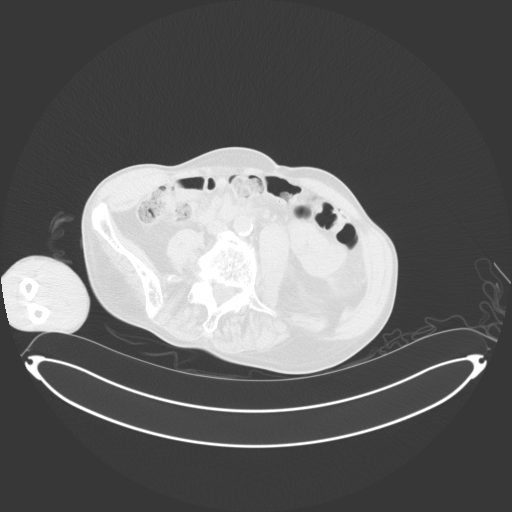
[im 84/91  lung]
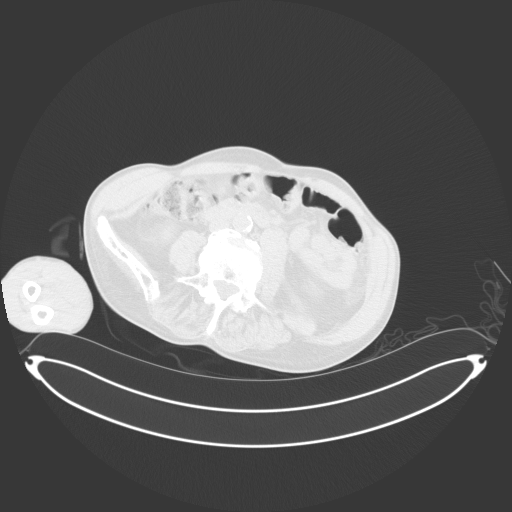
[im 87/91  soft-tissue]
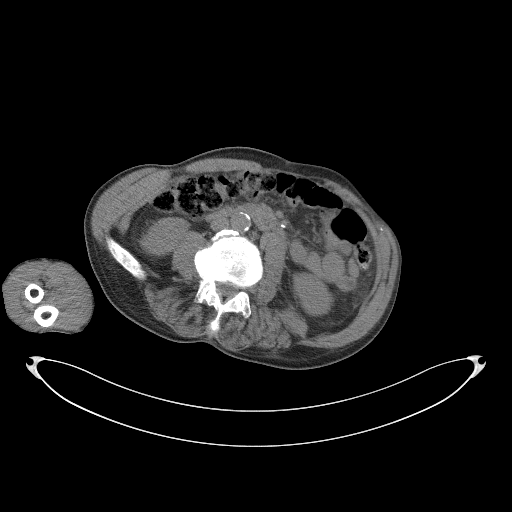
[im 87/91  lung]
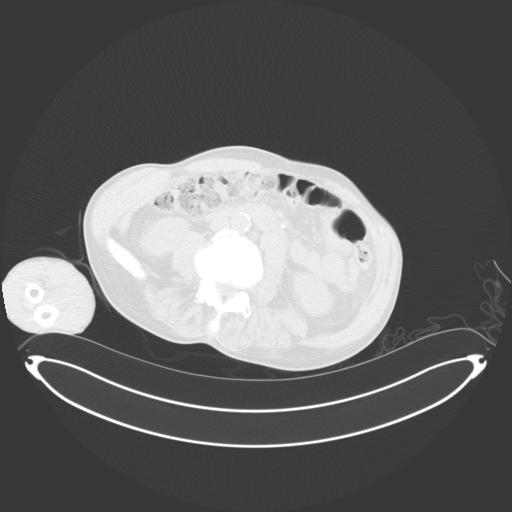

[15 of 32 positions shown; findings below may reference images not displayed]

EXAM:
CT-GUIDED ASPIRATION AND CORE BIOPSY OF THE LEFT PELVIC SIDEWALL
MASS

MEDICATIONS:
1% LIDOCAINE LOCAL

ANESTHESIA/SEDATION:
Moderate (conscious) sedation was employed during this procedure. A
total of Versed 4.0 mg and Fentanyl 100 mcg was administered
intravenously.

Moderate Sedation Time: 10 minutes. The patient's level of
consciousness and vital signs were monitored continuously by
radiology nursing throughout the procedure under my direct
supervision.

FLUOROSCOPY TIME:  Fluoroscopy Time: NONE.

COMPLICATIONS:
None immediate.

PROCEDURE:
Informed written consent was obtained from the patient after a
thorough discussion of the procedural risks, benefits and
alternatives. All questions were addressed. Maximal Sterile Barrier
Technique was utilized including caps, mask, sterile gowns, sterile
gloves, sterile drape, hand hygiene and skin antiseptic. A timeout
was performed prior to the initiation of the procedure.

Previous imaging reviewed. Patient positioned supine. Noncontrast
localization CT performed. The large left pelvic sidewall complex
mass was localized and marked for an anterolateral approach.

Under sterile conditions and local anesthesia, distally an 18 gauge
needle was advanced to the central aspect of the lesion. Syringe
aspiration performed. Small amount of brown serosanguineous fluid
obtained. No purulence sample. Sample was sent for cytology and
culture.

Next, a 17 gauge coaxial biopsy needle was advanced into the more
peripheral anterior aspect of the lesion along the solid component.
Needle position confirmed with CT. 18 gauge core biopsies obtained.
These were placed in formalin. Needle removed. Postprocedure imaging
demonstrates no hemorrhage or hematoma. Patient tolerated biopsy
well. Samples were intact and non fragmented.
IMPRESSION: Successful CT-guided aspiration of the left pelvic sidewall complex
partially cystic mass for cytology and culture

Successful CT-guided core biopsy of the left pelvic sidewall complex
masses well. Sent for pathology.
